# Patient Record
Sex: Female | Born: 1955 | Race: Black or African American | Hispanic: No | Marital: Single | Smoking: Current every day smoker
Health system: Southern US, Community
[De-identification: ages and names within clinical notes are randomized; demographics above are authoritative.]

## PROBLEM LIST (undated history)

## (undated) DIAGNOSIS — Z923 Personal history of irradiation: Secondary | ICD-10-CM

## (undated) DIAGNOSIS — I219 Acute myocardial infarction, unspecified: Secondary | ICD-10-CM

## (undated) DIAGNOSIS — B1920 Unspecified viral hepatitis C without hepatic coma: Secondary | ICD-10-CM

## (undated) HISTORY — PX: ABDOMINAL HYSTERECTOMY: SHX81

## (undated) HISTORY — DX: Acute myocardial infarction, unspecified: I21.9

## (undated) HISTORY — PX: BREAST EXCISIONAL BIOPSY: SUR124

## (undated) HISTORY — DX: Unspecified viral hepatitis C without hepatic coma: B19.20

## (undated) HISTORY — PX: BREAST BIOPSY: SHX20

## (undated) HISTORY — PX: TONSILLECTOMY: SUR1361

## (undated) HISTORY — PX: BILATERAL OOPHORECTOMY: SHX1221

## (undated) HISTORY — PX: JOINT REPLACEMENT: SHX530

---

## 2006-11-23 ENCOUNTER — Ambulatory Visit: Payer: Self-pay | Admitting: Family Medicine

## 2006-11-23 LAB — CONVERTED CEMR LAB
ALT: 37 units/L (ref 0–40)
BUN: 9 mg/dL (ref 6–23)
Bilirubin, Direct: 0.1 mg/dL (ref 0.0–0.3)
Calcium: 8.8 mg/dL (ref 8.4–10.5)
Cholesterol: 179 mg/dL (ref 0–200)
Eosinophils Absolute: 0.1 10*3/uL (ref 0.0–0.6)
GFR calc Af Amer: 137 mL/min
GFR calc non Af Amer: 113 mL/min
Glucose, Bld: 70 mg/dL (ref 70–99)
HDL: 61.5 mg/dL (ref >39.0)
Lymphocytes Relative: 61 % — ABNORMAL HIGH (ref 12.0–46.0)
MCV: 102.2 fL — ABNORMAL HIGH (ref 78.0–100.0)
Monocytes Relative: 6 % (ref 3.0–11.0)
Neutro Abs: 1.8 10*3/uL (ref 1.4–7.7)
Platelets: 236 10*3/uL (ref 150–400)
Triglycerides: 87 mg/dL (ref 0–149)

## 2006-11-30 ENCOUNTER — Encounter: Admission: RE | Admit: 2006-11-30 | Discharge: 2006-11-30 | Payer: Self-pay | Admitting: Family Medicine

## 2006-12-09 ENCOUNTER — Ambulatory Visit: Payer: Self-pay | Admitting: Gastroenterology

## 2006-12-21 ENCOUNTER — Ambulatory Visit: Payer: Self-pay | Admitting: Family Medicine

## 2006-12-21 LAB — CONVERTED CEMR LAB
Basophils Absolute: 0 10*3/uL (ref 0.0–0.1)
Basophils Relative: 0.5 % (ref 0.0–1.0)
Eosinophils Relative: 1 % (ref 0.0–5.0)
HCT: 38.9 % (ref 36.0–46.0)
Hemoglobin: 13.5 g/dL (ref 12.0–15.0)
MCHC: 34.7 g/dL (ref 30.0–36.0)
Monocytes Absolute: 2.2 10*3/uL — ABNORMAL HIGH (ref 0.2–0.7)
Neutrophils Relative %: 30 % — ABNORMAL LOW (ref 43.0–77.0)
RDW: 11.7 % (ref 11.5–14.6)
WBC: 7.2 10*3/uL (ref 4.5–10.5)

## 2008-02-29 ENCOUNTER — Inpatient Hospital Stay (HOSPITAL_COMMUNITY): Admission: RE | Admit: 2008-02-29 | Discharge: 2008-03-04 | Payer: Self-pay | Admitting: Orthopedic Surgery

## 2008-03-07 ENCOUNTER — Ambulatory Visit (HOSPITAL_COMMUNITY): Admission: RE | Admit: 2008-03-07 | Discharge: 2008-03-07 | Payer: Self-pay | Admitting: Orthopedic Surgery

## 2008-03-07 ENCOUNTER — Ambulatory Visit: Payer: Self-pay | Admitting: Surgery

## 2008-03-07 ENCOUNTER — Encounter (INDEPENDENT_AMBULATORY_CARE_PROVIDER_SITE_OTHER): Payer: Self-pay | Admitting: Orthopedic Surgery

## 2008-04-25 ENCOUNTER — Encounter (INDEPENDENT_AMBULATORY_CARE_PROVIDER_SITE_OTHER): Payer: Self-pay | Admitting: Orthopedic Surgery

## 2008-04-25 ENCOUNTER — Ambulatory Visit (HOSPITAL_COMMUNITY): Admission: RE | Admit: 2008-04-25 | Discharge: 2008-04-25 | Payer: Self-pay | Admitting: Orthopedic Surgery

## 2008-04-25 ENCOUNTER — Ambulatory Visit: Payer: Self-pay | Admitting: Vascular Surgery

## 2008-05-11 ENCOUNTER — Encounter (INDEPENDENT_AMBULATORY_CARE_PROVIDER_SITE_OTHER): Payer: Self-pay | Admitting: Orthopedic Surgery

## 2008-05-11 ENCOUNTER — Ambulatory Visit: Admission: RE | Admit: 2008-05-11 | Discharge: 2008-05-11 | Payer: Self-pay | Admitting: Orthopedic Surgery

## 2010-03-22 LAB — CONVERTED CEMR LAB
ALT: 42 units/L
AST: 33 units/L
Albumin: 3.8 g/dL
Alkaline Phosphatase: 29 units/L
Calcium: 9.1 mg/dL
Chloride: 106 meq/L
LDL Cholesterol: 73 mg/dL
Platelets: 243 10*3/uL
Potassium: 4.3 meq/L
RDW: 13.2 %
Sodium: 143 meq/L
VLDL: 22 mg/dL
WBC: 7.7 10*3/uL

## 2010-05-15 ENCOUNTER — Ambulatory Visit: Payer: Self-pay | Admitting: Family Medicine

## 2010-05-15 DIAGNOSIS — F17200 Nicotine dependence, unspecified, uncomplicated: Secondary | ICD-10-CM

## 2010-05-15 DIAGNOSIS — B182 Chronic viral hepatitis C: Secondary | ICD-10-CM

## 2010-05-15 DIAGNOSIS — L723 Sebaceous cyst: Secondary | ICD-10-CM

## 2010-05-15 DIAGNOSIS — I2510 Atherosclerotic heart disease of native coronary artery without angina pectoris: Secondary | ICD-10-CM | POA: Insufficient documentation

## 2010-05-15 LAB — CONVERTED CEMR LAB: Pap Smear: NEGATIVE

## 2010-05-23 ENCOUNTER — Encounter: Payer: Self-pay | Admitting: Family Medicine

## 2010-07-19 ENCOUNTER — Ambulatory Visit: Payer: Self-pay | Admitting: Family Medicine

## 2010-07-19 ENCOUNTER — Encounter: Admission: RE | Admit: 2010-07-19 | Discharge: 2010-07-19 | Payer: Self-pay | Admitting: Family Medicine

## 2010-07-22 ENCOUNTER — Telehealth (INDEPENDENT_AMBULATORY_CARE_PROVIDER_SITE_OTHER): Payer: Self-pay | Admitting: *Deleted

## 2010-09-11 ENCOUNTER — Encounter: Payer: Self-pay | Admitting: *Deleted

## 2010-09-11 ENCOUNTER — Ambulatory Visit: Payer: Self-pay | Admitting: Family Medicine

## 2010-09-25 ENCOUNTER — Encounter: Payer: Self-pay | Admitting: Family Medicine

## 2010-10-02 ENCOUNTER — Ambulatory Visit: Admission: RE | Admit: 2010-10-02 | Discharge: 2010-10-02 | Payer: Self-pay | Source: Home / Self Care

## 2010-10-02 DIAGNOSIS — F4321 Adjustment disorder with depressed mood: Secondary | ICD-10-CM | POA: Insufficient documentation

## 2010-10-04 ENCOUNTER — Telehealth: Payer: Self-pay | Admitting: Psychology

## 2010-10-17 ENCOUNTER — Encounter: Payer: Self-pay | Admitting: Family Medicine

## 2010-10-29 NOTE — Letter (Signed)
Summary: Results Follow-up Letter  All     ,     Phone:   Fax:     05/23/2010  [address] [place], [place]  [address]  Dear Ms. Babinski,   The following are the results of your recent test(s):  Test     Result     Pap Smear    Normal  You should not need further pap smear testing.  Sincerely,    Tinnie Gens MD

## 2010-10-29 NOTE — Assessment & Plan Note (Signed)
Summary: remove cyst and flu shot/bmc   Vital Signs:  Patient profile:   55 year old female Height:      67 inches Weight:      133 pounds BMI:     20.91 Temp:     98.6 degrees F oral Pulse rate:   123 / minute BP sitting:   90 / 60  (right arm) Cuff size:   regular  Vitals Entered By: Garen Grams LPN (July 19, 2010 9:28 AM) CC: remove cyst Is Patient Diabetic? No Pain Assessment Patient in pain? yes     Location: left thumb   CC:  remove cyst.  History of Present Illness: Here for cyst removal on back.  Has been sore for a few days.  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  It is swollen and tender.  She would like an x-ray. Noted lymph node swelling under right breast in past few days.  Has not noted any breast mass.  Habits & Providers  Alcohol-Tobacco-Diet     Alcohol drinks/day: 0     Tobacco Status: current     Tobacco Counseling: to quit use of tobacco products     Cigarette Packs/Day: 0.25  Current Problems (verified): 1)  Thumb Pain, Left  (ICD-729.5) 2)  Tobacco Use Disorder/smoker-smoking Cessation Discussed  (ICD-305.1) 3)  Preventive Health Care  (ICD-V70.0) 4)  Sebaceous Cyst  (ICD-706.2) 5)  Elbow Pain, Right  (ICD-719.42) 6)  Family History of Colon Ca 1st Degree Relative <60  (ICD-V16.0) 7)  Screening For Malignant Neoplasm of The Cervix  (ICD-V76.2) 8)  Coronary Artery Disease  (ICD-414.00) 9)  Hepatitis C  (ICD-070.51)  Current Medications (verified): 1)  Ultram 50 Mg Tabs (Tramadol Hcl) .... By Mouth Two Times A Day To Three Times A Day As Needed 2)  Lipitor 10 Mg Tabs (Atorvastatin Calcium) .... 1/2 Tab By Mouth Daily 3)  Aspirin 81 Mg Tbec (Aspirin) .Marland Kitchen.. 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    Physical Exam  General:  alert, well-developed, and well-nourished.   Head:  normocephalic and atraumatic.   Neck:  supple.   Lungs:  normal respiratory effort.   Heart:  normal rate.   Abdomen:  soft and non-tender.   Skin:  right mid back with sebaceous cyst.  Procedure:  Prepped with Betadine.  Small amount of sebum came out at that time.  Infiltrated with 6 cc Lidocaine.  Vertical skin incision made.  Cyst and wall removed with sharp and blunt dissection. Skin closed with 3 interuppted 4-0 nylon sutures. Axillary Nodes:  Small right mobile, firm node < 0.5 cm noted on right   Impression & Recommendations:  Problem # 1:  TOBACCO USE DISORDER/SMOKER-SMOKING CESSATION DISCUSSED (ICD-305.1)  Problem # 2:  SEBACEOUS CYST (ICD-706.2) Partner instructed on how to take out sutures in 7 days.  Keep area clean.  May shower tomorrow.  Ultram for pain. Orders: I&D Abcess, simple- [facility] (10060)  Problem # 3:  THUMB PAIN, LEFT (ICD-729.5) X-rays ordered and reviewed--negative.  Will use RICE for sprain.  Called pt. but number was disconnected.  Supportive measures were given. Orders: T-DG Finger Thumb*L*  (73140)  Complete Medication List: 1)  Ultram 50 Mg Tabs (Tramadol hcl) .... By mouth two times a day to three times a day as needed 2)  Lipitor 10 Mg Tabs (Atorvastatin calcium) .... 1/2 tab by mouth daily 3)  Aspirin 81 Mg Tbec (Aspirin) .Marland Kitchen.. 1 by mouth daily  Other Orders: [facility]- Est Level  3 (02585) T-Mammography Bilateral Screening (27782) Flu Vaccine 59yrs + (42353) Admin 1st Vaccine (61443)  Patient Instructions: 1)  Please schedule a follow-up appointment in 6 months .  2)  Or 1 week if needed for suture removal. Prescriptions: ULTRAM 50 MG TABS (TRAMADOL HCL) by mouth two times a day to three times a day as needed  #30 x 3   Entered and Authorized by:   Tinnie Gens MD   Signed by:   Tinnie Gens MD on 07/19/2010   Method used:   Print then Give to Patient   RxID:   [id number]    Orders Added: 1)  [facility]- Est Level  3 [67124] 2)  I&D Abcess, simple- [facility] [10060] 3)  T-DG Finger Thumb*L* [73140] 4)  T-Mammography Bilateral Screening [77057] 5)  Flu Vaccine 68yrs + [90658] 6)  Admin 1st Vaccine [58099]   Immunizations Administered:  Influenza Vaccine # 1:    Vaccine Type: Fluvax 3+    Site: right deltoid    Mfr: GlaxoSmithKline    Dose: 0.5 ml    Route: IM    Given by: Garen Grams LPN    Exp. Date: 03/26/2011    Lot #: [id number]    VIS given: 04/23/10 version given July 19, 2010.  Flu Vaccine Consent Questions:    Do you have a history of severe allergic reactions to this vaccine? no    Any prior history of allergic reactions to egg and/or gelatin? no    Do you have a sensitivity to the preservative Thimersol? no    Do you have a past history of Guillan-Barre Syndrome? no    Do you currently have an acute febrile illness? no    Have you ever had a severe reaction to latex? no    Vaccine information given and explained to patient? yes    Are you currently pregnant? no   Immunizations Administered:  Influenza Vaccine # 1:    Vaccine Type: Fluvax 3+     Site: right deltoid    Mfr: GlaxoSmithKline    Dose: 0.5 ml    Route: IM    Given by: Garen Grams LPN    Exp. Date: 03/26/2011    Lot #: [id number]    VIS given: 04/23/10 version given July 19, 2010.

## 2010-10-29 NOTE — Progress Notes (Signed)
Summary: triage  Phone Note Call from Patient Call back at [phone]   Caller: Patient Summary of Call: Hey---tried home number to tell her x-rays were negative, it's been disconnected.  Can you pass info along if she calls for results. Thanks T Initial call taken by: ---- 07/19/2010 1:59 PM, Tinnie Gens MD  Follow-up for Phone Call        LMOVM for pt to call back.  Follow-up by: Jone Baseman CMA  Additional Follow-up for Phone Call Additional follow up Details #1::        Pt wanting results of x-ray of hand.  Hand is still hurting very much and her job is requesting the results also. Additional Follow-up by: Clydell Hakim,  July 22, 2010 8:57 AM    Additional Follow-up for Phone Call Additional follow up Details #2::    LMOVM again Follow-up by: Jone Baseman CMA,  July 22, 2010 9:43 AM  Additional Follow-up for Phone Call Additional follow up Details #3:: Details for Additional Follow-up Action Taken: Shanda Bumps imformed pt of the above. Additional Follow-up by: Clydell Hakim,  July 22, 2010 12:27 PM

## 2010-10-29 NOTE — Assessment & Plan Note (Signed)
Summary: np,df   Vital Signs:  Patient profile:   55 year old female Height:      67 inches Weight:      134 pounds BMI:     21.06 BSA:     1.71 Temp:     98.0 degrees F Pulse rate:   96 / minute BP sitting:   113 / 78  Vitals Entered By: Jone Baseman CMA (May 15, 2010 10:14 AM) CC: New Patient Is Patient Diabetic? No Pain Assessment Patient in pain? yes     Location: right knee Intensity: 7   CC:  New Patient.  History of Present Illness: New pt. today.  Has several complaints.  Has right arm pain and injured it with jamming last yr.  Saw MD who said she had chipped a bone and injected it with steroid.  Now has depigmentation of area and continued pain in elbow radiating to upper arm, and shoulder.  Has tried tylenol. 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  She continues to smoke. Needs pap mammogram, lasat done in 2009.  Habits & Providers  Alcohol-Tobacco-Diet     Alcohol drinks/day: 0     Tobacco Status: current     Tobacco Counseling: to quit use of tobacco products     Cigarette Packs/Day: 0.25  Exercise-Depression-Behavior     Does Patient Exercise: yes     Exercise Counseling: not indicated; exercise is adequate     Exercise (avg: min/session): 30-60     Times/week: 3     STD Risk: never     Drug Use: former, crack cocaine     Seat Belt Use: always     Sun Exposure: infrequent     Cardiologist: Dr. Sharalyn Ink  Current Problems (verified): 1)  Screening For Malignant Neoplasm of The Cervix  (ICD-V76.2) 2)  Coronary Artery Disease  (ICD-414.00) 3)  Hepatitis C  (ICD-070.51)  Current Medications (verified): 1)  Ultram 50 Mg Tabs (Tramadol Hcl) .... By Mouth Two Times A Day To Three Times A Day As Needed 2)  Lipitor 10 Mg Tabs (Atorvastatin Calcium) .... 1/2 Tab By Mouth Daily 3)  Aspirin 81 Mg Tbec (Aspirin) .Marland Kitchen.. 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 Alcohol use-no Drug use-no Regular exercise-yes Smoking Status:  current Packs/Day:  0.25 Does Patient Exercise:  yes STD Risk:  never Drug Use:  former, crack cocaine Seat Belt Use:  always Sun Exposure-Excessive:  infrequent Occupation:  employed Alcohol:  None Sex Orientation:  Homosexual HIV Risk:  no risk noted Sexual History:  currently monogamous  Review of Systems  The patient denies anorexia, fever, weight loss, weight gain, chest pain, syncope, dyspnea on exertion, peripheral edema, prolonged cough, headaches, abdominal pain, melena, severe indigestion/heartburn, abnormal bleeding, and breast masses.    Physical Exam  General:  alert, well-developed, and well-nourished.   Head:  normocephalic and atraumatic.   Neck:  supple.   Breasts:  No mass, nodules, thickening, tenderness, bulging, retraction, inflamation, nipple discharge or skin changes noted.   Lungs:  normal respiratory effort and  normal breath sounds.   Heart:  normal rate, regular rhythm, and no murmur.   Abdomen:  soft and non-tender.   Genitalia:  normal introitus, no external lesions, no vaginal discharge, no vaginal atrophy cervix and uterus are absent.   Msk:  normal ROM, no joint tenderness, and no joint swelling.   Skin:  0.75 x 0.75 cm sebaceous cyst on back Psych:  Oriented X3, normally interactive, and good eye contact.     Shoulder/Elbow Exam  Shoulder Exam:    Right:    Inspection:  Normal    Palpation:  Normal    Stability:  stable     Tenderness:  right bicipital groove    Swelling:  no    Erythema:  no  Forearm Exam:    Right:    Inspection:  Normal    Palpation:  Normal  Elbow Exam:    Right:    Inspection:  Normal    Palpation:  Normal    Stability:  stable    Tenderness:  right lateral epicondyle    Swelling:  no    Erythema:  no   Impression & Recommendations:  Problem # 1:  Preventive Health Care (ICD-V70.0)  Orders: T-Mammography Bilateral Screening ([address])  Problem # 2:  CORONARY ARTERY DISEASE (ICD-414.00)  Her updated medication list for this problem includes:    Aspirin 81 Mg Tbec (Aspirin) .Marland Kitchen... 1 by mouth daily  Problem # 3:  HEPATITIS C (ICD-070.51)  Problem # 4:  ELBOW PAIN, RIGHT (ICD-719.42) Trial of Aleve for 1-2 wks if no improvement consider imaging, vs. other  Problem # 5:  SEBACEOUS CYST (ICD-706.2) to schedule removal at her convenience  Problem # 6:  TOBACCO USE DISORDER/SMOKER-SMOKING CESSATION DISCUSSED (ICD-305.1)  Complete Medication List: 1)  Ultram 50 Mg Tabs (Tramadol hcl) .... By mouth two times a day to three times a day as needed 2)  Lipitor 10 Mg Tabs (Atorvastatin calcium) .... 1/2 tab by mouth daily 3)  Aspirin 81 Mg Tbec (Aspirin) .Marland Kitchen.. 1 by mouth daily  Other Orders: Pap Smear-[facility] ([address]11)  Patient Instructions: 1)  Please schedule a follow-up appointment in 1 month for cyst removal.  Prescriptions: ULTRAM 50 MG TABS (TRAMADOL HCL) by mouth two times a day to three times a day as needed  #30 x 3   Entered and Authorized by:   Tinnie Gens MD   Signed by:   Tinnie Gens MD on 05/15/2010   Method used:   Print then Give to Patient   RxID:   [id number]

## 2010-10-29 NOTE — Miscellaneous (Signed)
Summary: Procedures Consent  Procedures Consent   Imported By: De Nurse 07/25/2010 15:55:29  _____________________________________________________________________  External Attachment:    Type:   Image     Comment:   External Document

## 2010-10-31 NOTE — Assessment & Plan Note (Signed)
Summary: F/U  KH   Vital Signs:  Patient profile:   55 year old female Height:      67 inches Weight:      139.50 pounds BMI:     21.93 BSA:     1.74 Temp:     97.7 degrees F Pulse rate:   90 / minute BP sitting:   127 / 80  Vitals Entered By: Jone Baseman CMA (October 02, 2010 2:36 PM) CC: f/u dog bite Is Patient Diabetic? No Pain Assessment Patient in pain? yes     Location: right leg Intensity: 10   Primary Care Provider:  Tinnie Gens MD  CC:  f/u dog bite.  History of Present Illness: Pt. reports not feeling well.  She has had her mother pass away, and was bitten by a dog at her job.  She is now suffering from moodiness, inability to sleep, not eating, tearful and an extreme fear of dogs.  Habits & Providers  Alcohol-Tobacco-Diet     Alcohol drinks/day: 0     Tobacco Status: current     Tobacco Counseling: to quit use of tobacco products     Cigarette Packs/Day: 0.5  Current Problems (verified): 1)  Foot Sprain, Tarsometatarsal  (ICD-845.11) 2)  Dog Bite  (ICD-E906.0) 3)  Tobacco Use Disorder/smoker-smoking Cessation Discussed  (ICD-305.1) 4)  Preventive Health Care  (ICD-V70.0) 5)  Family History of Colon Ca 1st Degree Relative <60  (ICD-V16.0) 6)  Screening For Malignant Neoplasm of The Cervix  (ICD-V76.2) 7)  Coronary Artery Disease  (ICD-414.00) 8)  Hepatitis C  (ICD-070.51)  Current Medications (verified): 1)  Ultram 50 Mg Tabs (Tramadol Hcl) .... By Mouth Two Times A Day To Three Times A Day As Needed 2)  Lipitor 10 Mg Tabs (Atorvastatin Calcium) .... 1/2 Tab By Mouth Daily 3)  Aspirin 81 Mg Tbec (Aspirin) .Marland Kitchen.. 1 By Mouth Daily 4)  Meloxicam 15 Mg Tabs (Meloxicam) .... One Tab By Mouth Daily For Pain. 5)  Zolpidem Tartrate 5 Mg Tabs (Zolpidem Tartrate) .Marland Kitchen.. 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  The patient denies weight loss, chest pain, dyspnea on exertion, prolonged cough, headaches, and abdominal pain.    Physical Exam  General:  alert, well-developed, and well-nourished.   Psych:  Oriented X3, depressed affect, flat affect, subdued, tearful, and judgment fair.     Impression & Recommendations:  Problem # 1:  DOG BITE (ICD-E906.0) Some mild variant of PTSD--needs therapist---referral to Spero Geralds. Orders: [facility]- Est Level  3 ([address])  Problem # 2:  GRIEF REACTION (ICD-309.0)  Orders: [facility]- Est Level  3 (60454)  Complete Medication List: 1)  Ultram 50 Mg Tabs (Tramadol hcl) .... By mouth two times a day to  three times a day as needed 2)  Lipitor 10 Mg Tabs (Atorvastatin calcium) .... 1/2 tab by mouth daily 3)  Aspirin 81 Mg Tbec (Aspirin) .Marland Kitchen.. 1 by mouth daily 4)  Meloxicam 15 Mg Tabs (Meloxicam) .... One tab by mouth daily for pain. 5)  Zolpidem Tartrate 5 Mg Tabs (Zolpidem tartrate) .Marland Kitchen.. 1-2 by mouth q hs as needed insomnia Prescriptions: ZOLPIDEM TARTRATE 5 MG TABS (ZOLPIDEM TARTRATE) 1-2 by mouth q hs as needed insomnia  #45 x 2   Entered and Authorized by:   Tinnie Gens MD   Signed by:   Tinnie Gens MD on 10/02/2010   Method used:   Print then Give to Patient   RxID:   [id number]    Orders Added: 1)  [facility]- Est Level  3 [14782]

## 2010-10-31 NOTE — Letter (Signed)
Summary: Work Excuse  [facility]  [address]   [place], [place] [address]   Phone: [phone]  Fax: [phone]    Today's Date: September 11, 2010  Name of Patient: Taylor Walker  The above named patient had a medical visit today at:  am / pm.  Please take this into consideration when reviewing the time away from work/school.    Special Instructions:  [  ] None  [  ] To be off the remainder of today, returning to the normal work / school schedule tomorrow.  [  ] To be off until the next scheduled appointment on ______________________.  [ x] Other:  May perform light duty and to be off of her feet for (2) two weeks, permission to drive.  Sincerely yours,   Loralee Pacas CMA

## 2010-10-31 NOTE — Assessment & Plan Note (Signed)
Summary: bit by dog/needs clearance for work/eo   Vital Signs:  Patient profile:   55 year old female Height:      67 inches Weight:      138.7 pounds BMI:     21.80 Temp:     98.4 degrees F oral Pulse rate:   98 / minute BP sitting:   109 / 75  (left arm) Cuff size:   regular  Vitals Entered By: Jimmy Footman, CMA (September 11, 2010 8:39 AM) CC: dog bite yesterday Is Patient Diabetic? No Comments went back to the ED last night and was told that she needed to be seen by ortho will need a referral to dr. Thamas Jaegers in [place] b/c of the strained foot.   Primary Care Moriya Mitchell:  Tinnie Gens MD  CC:  dog bite yesterday.  History of Present Illness: 55 yo female with recent dog bite.  Bite:  Occurred 09/10/10, unprovoked by a Location manager, bitten superficially on the L knee, she has one small puncture wound.  Was seen at [facility] ED.  Rx'ed keflex 500 QID for 7 days.  The dog is currently being observed at an animal shelter for 10 days.  She has no swelling, no erythema, pain is controlled.  Foot sprain:  She notes that when bitten, she fell backwards, started having immediate pain over the dorsum of her R foot.  Painful to walk.  She had xrays at the ED for this that were negative.  She had ACE wrap placed that improved her symptoms.  Current Medications (verified): 1)  Ultram 50 Mg Tabs (Tramadol Hcl) .... By Mouth Two Times A Day To Three Times A Day As Needed 2)  Lipitor 10 Mg Tabs (Atorvastatin Calcium) .... 1/2 Tab By Mouth Daily 3)  Aspirin 81 Mg Tbec (Aspirin) .Marland Kitchen.. 1 By Mouth Daily 4)  Meloxicam 15 Mg Tabs (Meloxicam) .... One Tab By Mouth Daily For Pain. 5)  Keflex 500 Mg Caps (Cephalexin) .... One Tab By Mouth Q6h X 7d  Allergies (verified): No Known Drug Allergies  Review of Systems       See HPI  Physical Exam  General:  Well-developed,well-nourished,in no acute distress; alert,appropriate and cooperative throughout examination Lungs:  Normal respiratory  effort, chest expands symmetrically. Lungs are clear to auscultation, no crackles or wheezes. Heart:  Normal rate and regular rhythm. S1 and S2 normal without gallop, murmur, click, rub or other extra sounds. Msk:  R Ankle: No visible erythema or swelling. Range of motion is full in all directions. Strength is 5/5 in all directions. Stable lateral and medial ligaments; squeeze test and kleiger test unremarkable; Talar dome non tender; No pain at base of 5th MT; No tenderness over cuboid or navicular; Minimal tenderness distal to cuboid over lisfranc joint. No tenderness on posterior aspects of lateral and medial malleolus No sign of peroneal tendon subluxations; Negative tarsal tunnel tinel's Able to walk 4 steps.   Extremities:  L knee with healed abrasion/superficial puncture on lateral aspect of knee.  There is no erythema or induration, no drainage, no evidency of lymphangitis.   Additional Exam:  Pt does have b/l pes planus.  Ankle was wrapped in ACE.   Impression & Recommendations:  Problem # 1:  DOG BITE (ICD-E906.0) Assessment New Pt to finish course of keflex. Pt will keep vigilant on status of dog and let us know if she will need rabies shots (can be sent to ED for this, they have a protocol) Local wound care.  RTC as needed.  Orders: [facility]- Est  Level 4 (81[address])  Problem # 2:  FOOT SPRAIN, TARSOMETATARSAL (ICD-845.11) Assessment: New Advised cont ACE for no more than 2 wks. Stop tylenol #3 and use mobic for pain. RTC 2 weeks to reassess. Can repeat XR with if still painful, would consider assessment and tx for lisfranc injury at that point.  (XR should be AP and wt bearing to assess for this injury) May return to work on light duty and may drive if off tylenol #3.  Orders: [facility]- Est  Level 4 (478[address])  Her updated medication list for this problem includes:    Ultram 50 Mg Tabs (Tramadol hcl) ..... By mouth two times a day to three times a day as needed    Aspirin 81  Mg Tbec (Aspirin) .Marland Kitchen... 1 by mouth daily    Meloxicam 15 Mg Tabs (Meloxicam) ..... One tab by mouth daily for pain.  Complete Medication List: 1)  Ultram 50 Mg Tabs (Tramadol hcl) .... By mouth two times a day to three times a day as needed 2)  Lipitor 10 Mg Tabs (Atorvastatin calcium) .... 1/2 tab by mouth daily 3)  Aspirin 81 Mg Tbec (Aspirin) .Marland Kitchen.. 1 by mouth daily 4)  Meloxicam 15 Mg Tabs (Meloxicam) .... One tab by mouth daily for pain. 5)  Keflex 500 Mg Caps (Cephalexin) .... One tab by mouth q6h x 7d  Patient Instructions: 1)  Great to meet you, 2)  Light duty off feet for 2 weeks, may drive. 3)  Stop the tylenol #3, Mobic for pain. 4)  Keep the foot ACE wrapped for 2 weeks. 5)  Come back to see me after 2 weeks to make sure you are doing well. 6)  Finish the course of antibiotics. 7)  -Dr. Karie Schwalbe. Prescriptions: MELOXICAM 15 MG TABS (MELOXICAM) One tab by mouth daily for pain.  #14 x 0   Entered and Authorized by:   Rodney Langton MD   Signed by:   Rodney Langton MD on 09/11/2010   Method used:   Print then Give to Patient   RxID:   [id number]    Orders Added: 1)  [facility]- Est  Level 4 [95284]

## 2010-10-31 NOTE — Progress Notes (Signed)
Summary: Schedule appt  Phone Note Call from Patient   Caller: Patient Call For: Spero Geralds, Psy.D. Summary of Call: Patient left VM to schedule an appt.  Called and left a VM. Initial call taken by: Spero Geralds PsyD,  October 04, 2010 11:13 AM  Follow-up for Phone Call        She called and we scheduled for 10/10/10 at 9:00.  This is through [employer] (she does not have insurance).  She will call to see if these services are covered and said she would call back if there was any reason she can't attend the appt. Follow-up by: Spero Geralds PsyD,  October 04, 2010 11:25 AM     Appended Document: Schedule appt Called to cancel the appt on the 11th secondary to not being able to get a hold of her workman's comp people.  She said she would call to reschedule.

## 2010-11-05 ENCOUNTER — Encounter: Payer: Self-pay | Admitting: *Deleted

## 2010-11-06 NOTE — Consult Note (Signed)
Summary: [facility]  [facility]   Imported By: De Nurse 10/29/2010 16:54:34  _____________________________________________________________________  External Attachment:    Type:   Image     Comment:   External Document

## 2010-11-06 NOTE — Consult Note (Signed)
Summary: [facility]   Imported By: De Nurse 10/29/2010 16:53:55  _____________________________________________________________________  External Attachment:    Type:   Image     Comment:   External Document

## 2010-12-17 ENCOUNTER — Telehealth: Payer: Self-pay | Admitting: *Deleted

## 2010-12-17 NOTE — Telephone Encounter (Signed)
Received refill request for tramadol . Will send message to Dr. Shawnie Pons. Pharmacy is [facility] [address] [place]

## 2010-12-18 MED ORDER — TRAMADOL HCL 50 MG PO TABS: ORAL_TABLET | ORAL | Status: DC

## 2010-12-18 NOTE — Telephone Encounter (Signed)
Spoke with Dr. Shawnie Pons this AM and she thought she had faxed back to pharmacy rx for ultram on 12/16/2010. Fax request that was faxed back to pharmacy was found howvever was not signed.  Dr. Shawnie Pons ok refill and RN will send in.

## 2010-12-24 ENCOUNTER — Telehealth: Payer: Self-pay | Admitting: Family Medicine

## 2010-12-24 NOTE — Telephone Encounter (Signed)
Pt checking status of rx for tramadol, pharmacy never received it, pt goes to [facility]

## 2010-12-26 MED ORDER — TRAMADOL HCL 50 MG PO TABS: ORAL_TABLET | ORAL | Status: DC

## 2011-02-11 NOTE — Op Note (Signed)
NAME:  Taylor Walker, HAMRE NO.:  [id number]   MEDICAL RECORD NO.:  [id number]          PATIENT TYPE:  INP   LOCATION:  0002                         FACILITY:  [facility]   PHYSICIAN:  Deidre Ala, M.D.    DATE OF BIRTH:  1956/04/20   DATE OF PROCEDURE:  02/29/2008  DATE OF DISCHARGE:                               OPERATIVE REPORT   PREOPERATIVE DIAGNOSIS:  End-stage degenerative joint disease right  knee, especially patellofemoral joint.   POSTOPERATIVE DIAGNOSIS:  End-stage degenerative joint disease right  knee, especially patellofemoral joint, with lateral compartment changes.   PROCEDURE:  Right total knee arthroplasty using cemented DePuy  components LCS type with rotating platform, again cemented, with MBT  revision stem.   SURGEON:  1. Charlesetta Shanks, M.D.   ASSISTANT:  Phineas Semen, P.A.-C.   ANESTHESIA:  General with LMA, with femoral nerve block.   CULTURES:  None.   DRAINS:  Two Hemovac drains, medium, to self suction.   ESTIMATED BLOOD LOSS:  50 mL.   REPLACEMENT:  Without.   TOURNIQUET TIME:  82 minutes.   PATHOLOGIC FINDINGS AND HISTORY:  [name] is a 55 year old female whose  early knee history started in the fall of 1980 when she had a  subluxation of her patella at a basketball game.  Ultimately had an open  patellar realignment, all soft tissue, at [facility].  She then had  an on-the-job injury, never had any subsequent knee problems until she  stepped off a step in a storage room on her job at [employer] on October 25, 2006.  She was seen in [facility] at  [facility], and had knee pain.  MRI scan showed grade 4 DJD  patellofemoral joint, with some lateral compartment changes.  She had  level pain 5-6/8, difficulty walking.  She consulted an orthopedic  surgeon in [facility], who said that she might need a patellofemoral  replacement or a total knee.  Ultimately, she was sent to me for a  second opinion.  That  discussion was carried out.  I felt she might be a  candidate for patellofemoral replacement, but the insurance company  would not improve anything other than a total knee replacement, and  hence that is what was done.  At surgery, she had marked end-stage  disease of the patellofemoral joint, with a thinned patella, bone on  bone on the lateral trochlea, with complete layer and remodeling.  There  was an area on the lateral femoral condyle of cartilage pitting in the  central zone that may have progressed.  The rest of the joint looked  fairly good.  We did put a total knee arthroplasty in and got excellent  ligament balance.  She was a bit of a difficult fit because her  anterior, posterior, medial, and lateral sides fit best with a standard  plus femur. However, to get the appropriate flexion and extension gap  without taking more than 5 mm on the extension side to get 12.5 mm of  poly in, we had cut more on the tibia, but then downsized the  flexion  gap to get the 12.5.  This notched about 4 mm the more proximal anterior  femur, but she has very hard bone, and we ended up grafting this at the  closure of the case.  This to allow [place] to get a 12.5 flexion gap.  The  extension gap was 12.5 without taking 7.5 mm on the distal cut, so we  compromised distal femoral cut to be less for a bit of the notch, but we  ended up with full extension and flexion to about 110 degrees, stable to  stressing, excellent overall alignment, using a standard plus right  femur, a rotating platform size standard plus, oval dome patella three-  peg 35 mm.  We used an MBT revision cemented tray size 4, which was  slightly large but we felt had overall better peripheral coverage, and a  universal fluted stem, 75 x a 16 for the MBT revision stem which I used  routinely because of its better mechanics for shunting weight distally  and to prevent tibial loosening over time with a closed-tip end, not  producing  significant stem pain.   PROCEDURE:  With adequate anesthesia obtained using femoral nerve block  and LMA technique, 1 g Ancef given for IV prophylaxis, and another one  at tourniquet letdown, the patient was placed in the supine position.  The right lower extremity was prepped from the toes to the tourniquet in  the standard fashion.  After standard prepping and draping, Esmarch  exsanguination was used, and the tourniquet was let up to 300 mmHg.  We  then incised the relatively straight somewhat hypertrophic scar over the  medial third of the patella and excised that and expanded it distally  and proximally.  Incision was deepened sharply with a knife and  hemostasis obtained using the Bovie electrocoagulator.  We then entered  the joint through the medial retinaculum.  The patella was everted.  Fat  pad excised, as well as scar tissue and osteophytes.  We then removed  the menisci and the cruciates.  I then amputated the tibial spine and  placed the drill down the canal in slight varus position to effect leg  valgus slight and then drilled downward, used the canal finder, and then  reamed up to a #16.  Off this, the 2-degree cutting jig was then put in  place and set at 6 mm, the cut made.  We made 2.5 more millimeters  because it seemed very tight in flexion, and we then sized the femur to  a standard plus, placed the anterior-posterior cutting jig in place with  the intramedullary guide, set it with a 17.5, set it, pinned it, made  the anterior cut, and it was a very shallow anterior cut.  We felt we  could downsize the flexion gap so moved it down 2.5 more millimeters,  made the anterior cut and then the posterior cut, fitting the 12,5 in  flexion, but the slight notch anteriorly.  I then did the 4-degree  distal valgus femoral cutting jig, put that in place.  I had to set it  back 5 more millimeters to accommodate 12.5 in extension, made the cut.  We fit 12.5 in flexion and  extension.  I then placed a finishing guide  on the distal femur, made the anterior, posterior, and chamfer, the  notch cut, as well as a far posterior cut.  I then exposed the proximal  tibia, sized it to a 4, reamed, the conical  reamer proximally and the  distal reamer with the 16 x 75.  I then placed a trial in place with the  appropriate rotation and impacted it with the keel punch.  I then placed  the trial tibial component in place, rotating platform.  I placed the  femoral component in place and articulated the knee through a range of  motion.  I then calipered the patella, started at a 28, felt like it had  been somewhat worn, and actually significantly worn by the arthritis, so  we cut it, set the cutting jig on 16, actually cut to a 14 with the  cutting jig, cutting the patella down to a 14, removing all of the  sclerotic bone to fresh cancellous bone, placed the three-peg hole guide  for the 35, drilled the holes, place a trial, and articulated it through  a range of motion.  All trial components were then removed while  components were brought on the field and checked for sizing.  We then  thoroughly jet lavaged the knee.  We then assembled the tibial  component.  We then mixed cement with tobramycin, 2 batches of cement  with 1.35 g of tobramycin per batch.  We then cemented on the tibial  component, impacted it, and removed excess cement.  We placed the  rotating platform.  We then cemented on the femoral component, impacted  it, and removed excess cement, and held the knee in full extension, and  removed excess cement.  We then cemented on the patella component,  impacted it, removed excess cement, and held the knee in slight flexion  until the cement had cured.  We then re-jet lavaged the knee with the  remaining solution.  Tourniquet was let down.  Bleeding points were  cauterized, and 10 mg of FloSeal was injected in the wound.  We then  placed Hemovac drains in the  medial and lateral gutter and brought it  out the superior lateral portal.  The wound was then closed in layers  with #1 Vicryl figure-of-eight interrupted on the retinaculum, with a  running-locking oversew with #1 PDS; 0 and 2-0 Vicryl were then used on  the subcutaneous, and a running 3-0 Monocryl.  Steri-Strips were  applied.  We then injected a mixture of 40 mg of gentamicin in 20 mL of  sterile saline into the knee through one of the Hemovacs and then close  them off with rubber shot.  They will be let open in about  2 hours in the PACU.  We then placed a bulky sterile compressive  dressing with a knee immobilizer.  The patient, having tolerated the  procedure well, was awakened and taken to the recovery room in  satisfactory condition, to be admitted for routine postoperative care  and CPM and analgesia.           ______________________________  V. Charlesetta Shanks, M.D.     VEP/MEDQ  D:  02/29/2008  T:  02/29/2008  Job:  [id number]

## 2011-02-11 NOTE — Discharge Summary (Signed)
Taylor Walker, Taylor Walker NO.:  [id number]   MEDICAL RECORD NO.:  [id number]          PATIENT TYPE:  INP   LOCATION:  1606                         FACILITY:  [facility]   PHYSICIAN:  Deidre Ala, M.D.    DATE OF BIRTH:  12/10/1955   DATE OF ADMISSION:  02/29/2008  DATE OF DISCHARGE:  03/04/2008                               DISCHARGE SUMMARY   FINAL DIAGNOSES:  1. Degenerative joint disease, right knee, end-stage.  2. Postoperative blood-loss anemia.   PROCEDURES:  Right total knee arthroplasty with DePuy LCS with MBT stem,  rotating platform.   SURGEON:  1. Charlesetta Shanks, M.D.   HISTORY:  This is a 55 year old African-American female who is followed  by Dr. Renae Fickle.  She complained of right knee pain.  She was treated  conservatively initially.  This failed.  She was deemed to need a total  knee arthroplasty.  Because of this, she was scheduled.   HOSPITAL COURSE:  She was admitted to [facility] on 02/29/08.  At that  time, she underwent a right total knee arthroplasty.  Patient tolerated  the procedure well.  No intraoperative complications.  Postoperatively,  the patient did well.  No untoward events occurred.  She did have a  couple of spiked temperatures but overall, nothing significant occurred.  She had postop blood-loss anemia noted.  This did not recall blood  product replacement.  She was started on Lovenox as per routine.  She  was seen by physical therapy and reportedly was getting out of bed and  ambulating.  She had drains in until the second postoperative day, at  which time these were changed.  The drains have been full drainage.  The  incision was healing well.   By the fourth postoperative day, she was ready for discharge.  She was  able to get out of bed by herself with just a walker without any  assistance.  Subsequently, she was ready for discharge.   DISCHARGE MEDICATIONS:  1. Lovenox 30 mg subcu 1 q.12h. x10 days.  2. Skelaxin 800 mg 1 p.o.  q.i.d. p.r.n. for muscle spasms.  3. Lortab 5/500 1-2 p.o. q.4-6h. p.r.n. pain.  4. She will continue on her regular medicines that she was taking      prior to admission, which was just actually a over-the-counter      vitamin tablet, and Sudafed as needed for allergies.  Otherwise,      she was taking no other medications.   She had no known drug allergies.   She was subsequently discharged to home on 03/04/08 in satisfactory and  stable condition.  She will follow up with Dr. Renae Fickle in 10 days.      Phineas Semen, P.A.    ______________________________  Seth Bake. Charlesetta Shanks, M.D.    CL/MEDQ  D:  03/04/2008  T:  03/04/2008  Job:  [id number]

## 2011-02-14 NOTE — Assessment & Plan Note (Signed)
[facility]                        [place] OFFICE NOTE   IMAYA, DUFFY                       MRN:          [id number]  DATE:11/23/2006                            DOB:          Feb 20, 1956    The patient is a 55 year old African-American female who presents to the  office today to establish and for a physical exam.  The patient is  concerned about weight loss.  Apparently she has lost over 20 pounds in  the last few years.  She states she is eating normally and has been  drinking Ensure three times a day with two meals for several years.  She  has always done this; there has been no change in her diet.  The patient  recently moved back from [place].  She was working as a Solicitor with the  [employer] and had been living in [place] for the last couple of years  where she was going to [facility] for hepatitis C clinic.  She is also here  today to get established with a new hepatitis specialist in town.  She  has an appointment with her gynecologist on March 13 for her annual Pap  smear and mammogram.   PAST MEDICAL HISTORY:  Significant for hepatitis C.  She was treated  with interferon at [facility].   PAST SURGICAL HISTORY:  Significant for three rotator cuff surgeries.   FAMILY HISTORY:  She has a mother who is alive with history of colon  cancer.  Father died from unknown cancer.  There is no other family  history.   SOCIAL HISTORY:  She smokes a half a pack a day for the last 20 years.  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  She is single and not currently sexually active.  She exercises three times a week.   MEDICATIONS:  She is only taking tramadol 50 mg once a day as needed for  pain.  She has no known drug allergies.  Last tetanus shot was in  2006.  She received the flu shot in 2007.   PHYSICAL EXAMINATION:  VITAL SIGNS:  Her height is 5 feet 7-and-a-half  inches, weight 128.8, temperature 98.1, pulse 88, respirations 20, blood  pressure is 104/64.  GENERAL:  She is awake, alert and oriented, in no acute distress, but is  very cachectic looking.  HEENT:  Head is normocephalic, atraumatic.  Eyes:  Pupils equal and  reactive to light.  Sclerae are white.  Tympanic membranes are intact  bilaterally and clear.  Nose:  Turbinates are pink.  Oropharynx is clear  and mucous membranes are moist.  NECK:  Supple, no JVD, no bruits.  Thyroid is normal.  RESPIRATIONS:  Lungs are clear bilaterally to auscultation.  No rales,  rhonchi or wheezing.  HEART:  Positive S1, S2.  No murmurs are appreciated.  ABDOMEN:  Soft and nontender.  No masses are palpated.  MUSCULOSKELETAL:  Shows good strength in all four extremities, no  sensorimotor deficits.  SKIN:  No rashes or significant moles.  GYNECOLOGIC:  Exam to be done in March by the gynecologist.   EKG:  Sinus rhythm, about 81 beats per minute.   ASSESSMENT AND PLAN:  1. Complete physical examination.  Pap is to be done by the GYN.  Will      check fasting labs today.  General health maintenance is not up-to-      date but she is working on getting her mammogram scheduled with the      gynecologist.  2. History of hepatitis C.  Will need to transfer to medical specialty      clinic.  She will arrange for [place] to receive her medical records      from [facility] so we can arrange for her to see them as soon as possible.  3. History of weight loss.  Will check HIV and other STDs in the blood      work.  Will get a chest x-ray and refer to GI for colonoscopy.  Did      encourage the patient to eat three meals a day as well as adding      the Ensure to the diet, and she will follow up with [place] in 2 weeks      for a weight check, or sooner if necessary.     Lelon Perla, DO  Electronically  Signed    Shawnie Dapper  DD: 11/24/2006  DT: 11/24/2006  Job #: [id number]

## 2011-02-14 NOTE — Assessment & Plan Note (Signed)
Taylor Walker                         GASTROENTEROLOGY OFFICE NOTE   Taylor Walker, Taylor Walker                       MRN:          [id number]  DATE:12/09/2006                            DOB:          [birth year]    REASON FOR REFERRAL:  Weight loss and family history of colon cancer.   HISTORY OF PRESENT ILLNESS:  This is a 55 year old African American  female with a self reported weight loss of 20 pounds over the past 8-9  months.  She states her appetite is good.  She notes no gastrointestinal  complaints whatsoever and specifically denies any abdominal pain, change  in bowel habits, diarrhea, constipation, change in stool caliber,  melena, hematochezia, nausea, vomiting, or reflux symptoms.  A recent  metabolic panel, CBC, lipid profile, and TSH were all unremarkable  except for an elevated MCV at 102.2 and a minimally elevated LDL  cholesterol at 100.  Chest x-ray was negative.  Her mother developed  colon cancer at age 56, no other family members with colon cancer, colon  polyps, or inflammatory bowel disease.   PAST MEDICAL HISTORY:  1. Hepatitis C, diagnosed in about 1999 at [facility] of      [place].  She states she was treated with interferon and ribavirin      in 2000 and that failed.  She states she has been followed for this      problem and has had a subsequent liver biopsy in 2005 that shows      very minimal activity and no worrisome findings.  2. Status post hernia repair 1986.  3. Status post breast biopsy.  4. Status post appendectomy.   CURRENT MEDICATIONS:  Multivitamin daily.   MEDICATION ALLERGIES:  NONE KNOWN.   SOCIAL HISTORY:  Per the handwritten form.   REVIEW OF SYSTEMS:  Per the handwritten form.   PHYSICAL EXAMINATION:  Thin, in no acute distress.  Height 5 feet 8  inches, weight 128.8 pounds, blood pressure 106/70, pulse 80 and  regular.  HEENT:  Anicteric sclerae, oropharynx clear.  CHEST:  Clear to  auscultation bilaterally.  CARDIAC:  Regular rate and rhythm without murmurs appreciated.  ABDOMEN:  Soft, nontender, nondistended, normoactive bowel sounds, no  palpable organomegaly, masses, or hernias.  RECTAL:  Deferred to time of colonoscopy.  EXTREMITIES:  Without clubbing, cyanosis, edema.  NEUROLOGIC:  Alert and oriented x3, grossly nonfocal.   ASSESSMENT/PLAN:  1. Self reported weight loss of 20 pounds.  First degree relative with      colon cancer at age 70.  She has no obvious gastrointestinal      disorder but colorectal neoplasms should be excluded.  Risks,      benefits, and alternatives to colonoscopy with possible biopsy and      possible polypectomy discussed with the patient and she consents to      proceed.  This will be scheduled electively.  2. Chronic hepatitis C.  Referral to the medical specialty clinic for      ongoing management.  It appears that she has minimal disease and      she has  been monitored on an interval basis.  3. Macrocytosis.  Further evaluation per Dr. Laury Walker.     Venita Lick. [name], MD, [facility]  Electronically Signed    MTS/MedQ  DD: 12/14/2006  DT: 12/15/2006  Job #: [id number]   cc:   Taylor Perla, DO

## 2011-04-08 ENCOUNTER — Other Ambulatory Visit: Payer: Self-pay | Admitting: Family Medicine

## 2011-04-08 MED ORDER — TRAMADOL HCL 50 MG PO TABS: ORAL_TABLET | ORAL | Status: DC

## 2011-04-16 ENCOUNTER — Ambulatory Visit (INDEPENDENT_AMBULATORY_CARE_PROVIDER_SITE_OTHER): Payer: BC Managed Care – PPO | Admitting: Family Medicine

## 2011-04-16 ENCOUNTER — Ambulatory Visit
Admission: RE | Admit: 2011-04-16 | Discharge: 2011-04-16 | Disposition: A | Discharge status: Home / Self Care | Payer: BC Managed Care – PPO | Source: Ambulatory Visit | Attending: Family Medicine | Admitting: Family Medicine

## 2011-04-16 ENCOUNTER — Encounter: Payer: Self-pay | Admitting: Family Medicine

## 2011-04-16 ENCOUNTER — Telehealth: Payer: Self-pay | Admitting: *Deleted

## 2011-04-16 DIAGNOSIS — R634 Abnormal weight loss: Secondary | ICD-10-CM

## 2011-04-16 DIAGNOSIS — Z1231 Encounter for screening mammogram for malignant neoplasm of breast: Secondary | ICD-10-CM

## 2011-04-16 DIAGNOSIS — M1990 Unspecified osteoarthritis, unspecified site: Secondary | ICD-10-CM | POA: Insufficient documentation

## 2011-04-16 DIAGNOSIS — M129 Arthropathy, unspecified: Secondary | ICD-10-CM

## 2011-04-16 DIAGNOSIS — G4700 Insomnia, unspecified: Secondary | ICD-10-CM | POA: Insufficient documentation

## 2011-04-16 DIAGNOSIS — Z0000 Encounter for general adult medical examination without abnormal findings: Secondary | ICD-10-CM

## 2011-04-16 DIAGNOSIS — I2510 Atherosclerotic heart disease of native coronary artery without angina pectoris: Secondary | ICD-10-CM

## 2011-04-16 LAB — CBC
HCT: 43.5 % (ref 36.0–46.0)
Hemoglobin: 14.9 g/dL (ref 12.0–15.0)
MCH: 33.5 pg (ref 26.0–34.0)
MCHC: 34.3 g/dL (ref 30.0–36.0)
RDW: 12.4 % (ref 11.5–15.5)

## 2011-04-16 LAB — BASIC METABOLIC PANEL
CO2: 25 mEq/L (ref 19–32)
Calcium: 9.7 mg/dL (ref 8.4–10.5)
Chloride: 105 mEq/L (ref 96–112)
Creat: 0.87 mg/dL (ref 0.50–1.10)
Glucose, Bld: 73 mg/dL (ref 70–99)

## 2011-04-16 LAB — TSH: TSH: 0.321 u[IU]/mL — ABNORMAL LOW (ref 0.350–4.500)

## 2011-04-16 LAB — LIPID PANEL
HDL: 67 mg/dL (ref >39)
Triglycerides: 82 mg/dL (ref <150)

## 2011-04-16 MED ORDER — ZOLPIDEM TARTRATE 5 MG PO TABS: 5.0000 mg | ORAL_TABLET | Freq: Every evening | ORAL | Status: DC | PRN

## 2011-04-16 MED ORDER — ATORVASTATIN CALCIUM 10 MG PO TABS: 10.0000 mg | ORAL_TABLET | Freq: Every day | ORAL | Status: DC

## 2011-04-16 MED ORDER — TRAMADOL HCL 50 MG PO TABS
ORAL_TABLET | ORAL | Status: DC
Start: 2011-04-16 — End: 2011-09-22

## 2011-04-16 NOTE — Telephone Encounter (Signed)
Received call from pharmacy  stating they need clarification on directions for generic lipitor. There are two directions  on RX.  Dr. Shawnie Pons advised this AM when she left clinic that she will be on vacation, will send to preceptor for advise.

## 2011-04-16 NOTE — Progress Notes (Signed)
  Subjective:     Taylor Walker is a 55 y.o. female and is here for a comprehensive physical exam. The patient reports problems - Weight loss.  History   Social History  . Marital Status: Single    Spouse Name: N/A    Number of Children: N/A  . Years of Education: N/A   Occupational History  . Not on file.   Social History Main Topics  . Smoking status: Current Everyday Smoker -- 0.3 packs/day    Types: Cigarettes  . Smokeless tobacco: Not on file  . Alcohol Use: Not on file  . Drug Use: Not on file  . Sexually Active: Not on file   Other Topics Concern  . Not on file   Social History Narrative  . No narrative on file   Health Maintenance  Topic Date Due  . Pap Smear  03/13/1976  . Tetanus/tdap  03/13/1977  . Mammogram  03/13/2008  . Colonoscopy  03/13/2008  . Influenza Vaccine  06/30/2011    The following portions of the patient's history were reviewed and updated as appropriate: allergies, current medications, past family history, past medical history, past social history, past surgical history and problem list.  Review of Systems A comprehensive review of systems was negative.   Objective:    BP 120/74  Pulse 80  Temp(Src) 97.9 F (36.6 C) (Oral)  Wt 135 lb (61.236 kg) General appearance: alert, cooperative and appears stated age Head: Normocephalic, without obvious abnormality, atraumatic Neck: no adenopathy, supple, symmetrical, trachea midline and thyroid not enlarged, symmetric, no tenderness/mass/nodules Lungs: clear to auscultation bilaterally Heart: regular rate and rhythm, S1, S2 normal, no murmur, click, rub or gallop Abdomen: soft, non-tender; bowel sounds normal; no masses,  no organomegaly Extremities: extremities normal, atraumatic, no cyanosis or edema Pulses: 2+ and symmetric Skin: Skin color, texture, turgor normal. No rashes or lesions Neurologic: Grossly normal    Assessment:    Healthy female exam. Weight loss     Plan:    Does not  need pap smear. Check labs. Refill meds. Mammogram See After Visit Summary for Counseling Recommendations

## 2011-04-16 NOTE — Telephone Encounter (Signed)
Spoke with patient and she states she has been taking 1/2 tab of the atorvastatin daily. Will ask MD to send in new RX.

## 2011-04-17 ENCOUNTER — Encounter: Payer: Self-pay | Admitting: Family Medicine

## 2011-04-17 MED ORDER — ATORVASTATIN CALCIUM 10 MG PO TABS: ORAL_TABLET | ORAL | Status: DC

## 2011-04-17 NOTE — Telephone Encounter (Signed)
New Rx sent.

## 2011-04-28 ENCOUNTER — Telehealth: Payer: Self-pay | Admitting: Family Medicine

## 2011-04-28 DIAGNOSIS — I2510 Atherosclerotic heart disease of native coronary artery without angina pectoris: Secondary | ICD-10-CM

## 2011-04-28 MED ORDER — NITROGLYCERIN 0.4 MG SL SUBL: 0.4000 mg | SUBLINGUAL_TABLET | SUBLINGUAL | Status: DC | PRN

## 2011-04-28 NOTE — Telephone Encounter (Signed)
Just saw Dr Shawnie Pons last week and she is now needing refill on her Nitro pills.  She no longer sees cardiologist and wants this refilled by Dr Shawnie Pons.  She already refilled her Lipitor from the other doctor and now needs Nitro.

## 2011-04-28 NOTE — Assessment & Plan Note (Signed)
Needs refill on Nitro

## 2011-06-25 LAB — URINALYSIS, ROUTINE W REFLEX MICROSCOPIC
Hgb urine dipstick: NEGATIVE
Protein, ur: NEGATIVE
Urobilinogen, UA: 1

## 2011-06-25 LAB — COMPREHENSIVE METABOLIC PANEL
AST: 28
Albumin: 3.5
BUN: 12
Calcium: 9.2
Chloride: 107
Creatinine, Ser: 0.83
GFR calc Af Amer: >60
Total Protein: 6

## 2011-06-25 LAB — DIFFERENTIAL
Basophils Absolute: 0
Eosinophils Relative: 2
Lymphocytes Relative: 31
Lymphs Abs: 1.9
Monocytes Absolute: 0.7
Monocytes Relative: 11
Neutro Abs: 3.5

## 2011-06-25 LAB — CBC
HCT: 39.5
MCV: 99.6
Platelets: 219
RDW: 11.9
WBC: 6.2

## 2011-06-25 LAB — APTT: aPTT: 32

## 2011-06-25 LAB — URINE CULTURE
Culture: NO GROWTH
Special Requests: NEGATIVE

## 2011-06-26 LAB — CBC
HCT: 30.8 — ABNORMAL LOW
HCT: 33 — ABNORMAL LOW
Hemoglobin: 11.2 — ABNORMAL LOW
MCHC: 34
MCV: 99.2
Platelets: 170
Platelets: 173
RDW: 12.3
RDW: 12.3
RDW: 12.6
WBC: 10.5

## 2011-06-26 LAB — BASIC METABOLIC PANEL
BUN: 5 — ABNORMAL LOW
CO2: 27
Chloride: 101
GFR calc non Af Amer: >60
Glucose, Bld: 107 — ABNORMAL HIGH
Glucose, Bld: 129 — ABNORMAL HIGH
Potassium: 4
Potassium: 4.3
Sodium: 136

## 2011-06-26 LAB — TYPE AND SCREEN: ABO/RH(D): A POS

## 2011-09-11 ENCOUNTER — Telehealth: Payer: Self-pay | Admitting: Family Medicine

## 2011-09-11 NOTE — Telephone Encounter (Signed)
Pt needs to talk to nurse about her lipitor - having some strange side affects

## 2011-09-11 NOTE — Telephone Encounter (Signed)
Left message for patient to call back  

## 2011-09-12 NOTE — Telephone Encounter (Signed)
Spoke with patient, she states she took the Lipitor 10mg  on Monday and Tuesday morning and that by Tues and Wed night she was extremely paranoid and didn't take anymore. Patient states she used to take Lipitor 80mg  with no problem. Message to MD.

## 2011-09-12 NOTE — Telephone Encounter (Signed)
Left another message for patient to call back 

## 2011-09-15 NOTE — Telephone Encounter (Signed)
Pt called back, states that MD LMovm asking her to call back here.  Not sure as I do not have a note from MD.  Will forward back to her.  FYI: pt states that only thing she can think of is that this was the generic brand and she used to take the name brand. Shanan Mcmiller, Maryjo Rochester

## 2011-09-17 NOTE — Telephone Encounter (Signed)
Per note, pt. Prev. On Lipitor 80.  Written for lipitor 10 and took 1/2 of dosage. I was questioning dose.  Rx sent in July, is she just now taking it for the first time.  Does she need non-generic?  Please call pt. And ascertain this info?

## 2011-09-17 NOTE — Telephone Encounter (Signed)
LMOVM for pt to schedule an appt with Dr. Shawnie Pons to discuss this med. Shaughnessy Gethers, Maryjo Rochester

## 2011-09-22 ENCOUNTER — Other Ambulatory Visit: Payer: Self-pay | Admitting: Family Medicine

## 2011-09-25 ENCOUNTER — Encounter (INDEPENDENT_AMBULATORY_CARE_PROVIDER_SITE_OTHER): Payer: BC Managed Care – PPO | Admitting: Family Medicine

## 2011-09-25 ENCOUNTER — Encounter: Payer: Self-pay | Admitting: Family Medicine

## 2011-09-25 ENCOUNTER — Telehealth: Payer: Self-pay | Admitting: *Deleted

## 2011-09-25 NOTE — Telephone Encounter (Signed)
New lipitor that she was given on July had paranoid side affects.  The original Rx was name brand lipitor, the new one was generic.  Pt came for appt but had to reschedule would like Md to give her a call.Taylor Walker, Taylor Walker

## 2011-09-26 MED ORDER — ATORVASTATIN CALCIUM 80 MG PO TABS: 80.0000 mg | ORAL_TABLET | Freq: Every day | ORAL | Status: DC

## 2011-09-26 NOTE — Telephone Encounter (Signed)
Back to MD Fleeger, Maryjo Rochester

## 2011-09-26 NOTE — Progress Notes (Signed)
This encounter was created in error - please disregard.

## 2011-09-26 NOTE — Telephone Encounter (Signed)
Lipitor is the brand and 80mg  that she needs.  Doesn't need generic

## 2011-09-26 NOTE — Telephone Encounter (Signed)
-----   Message from Reva Bores, MD sent at 09/26/2011 11:31 AM -----  Should be at pharmacy   Pt informed that Rx was sent. Fleeger, Maryjo Rochester

## 2011-09-26 NOTE — Telephone Encounter (Signed)
LVM for pt. About generic vs. Name brand and exact dosage of medication she is supposed to be on.  She will return call and I will try to correct today.

## 2011-10-28 ENCOUNTER — Ambulatory Visit: Payer: BC Managed Care – PPO | Admitting: Family Medicine

## 2011-11-07 ENCOUNTER — Other Ambulatory Visit: Payer: Self-pay | Admitting: Family Medicine

## 2011-11-10 NOTE — Telephone Encounter (Signed)
LMOVM of pharmacy with refill request. Taylor Walker, Taylor Walker

## 2011-11-10 NOTE — Telephone Encounter (Signed)
Ok to refill, please call in for pt.  Cannot be e-prescribed.

## 2011-12-10 ENCOUNTER — Other Ambulatory Visit: Payer: Self-pay | Admitting: Family Medicine

## 2012-02-17 ENCOUNTER — Other Ambulatory Visit: Payer: Self-pay | Admitting: Family Medicine

## 2012-03-11 ENCOUNTER — Other Ambulatory Visit: Payer: Self-pay | Admitting: Family Medicine

## 2012-03-11 DIAGNOSIS — Z1231 Encounter for screening mammogram for malignant neoplasm of breast: Secondary | ICD-10-CM

## 2012-03-29 ENCOUNTER — Ambulatory Visit (INDEPENDENT_AMBULATORY_CARE_PROVIDER_SITE_OTHER): Payer: BC Managed Care – PPO | Admitting: Family Medicine

## 2012-03-29 ENCOUNTER — Encounter: Payer: Self-pay | Admitting: Family Medicine

## 2012-03-29 ENCOUNTER — Ambulatory Visit
Admission: RE | Admit: 2012-03-29 | Discharge: 2012-03-29 | Disposition: A | Discharge status: Home / Self Care | Payer: BC Managed Care – PPO | Source: Ambulatory Visit | Attending: Family Medicine | Admitting: Family Medicine

## 2012-03-29 VITALS — BP 106/70 | HR 80 | Temp 98.2°F | Ht 67.0 in | Wt 144.0 lb

## 2012-03-29 DIAGNOSIS — Z1231 Encounter for screening mammogram for malignant neoplasm of breast: Secondary | ICD-10-CM

## 2012-03-29 DIAGNOSIS — M1990 Unspecified osteoarthritis, unspecified site: Secondary | ICD-10-CM

## 2012-03-29 DIAGNOSIS — G4700 Insomnia, unspecified: Secondary | ICD-10-CM

## 2012-03-29 DIAGNOSIS — I2510 Atherosclerotic heart disease of native coronary artery without angina pectoris: Secondary | ICD-10-CM

## 2012-03-29 DIAGNOSIS — B1710 Acute hepatitis C without hepatic coma: Secondary | ICD-10-CM

## 2012-03-29 DIAGNOSIS — M129 Arthropathy, unspecified: Secondary | ICD-10-CM

## 2012-03-29 MED ORDER — ATORVASTATIN CALCIUM 10 MG PO TABS: 2.5000 mg | ORAL_TABLET | Freq: Every day | ORAL | Status: DC

## 2012-03-29 MED ORDER — ASPIRIN 81 MG PO TBEC: 81.0000 mg | DELAYED_RELEASE_TABLET | Freq: Every day | ORAL | Status: AC

## 2012-03-29 MED ORDER — ZOLPIDEM TARTRATE 5 MG PO TABS: 5.0000 mg | ORAL_TABLET | Freq: Every evening | ORAL | Status: DC | PRN

## 2012-03-29 MED ORDER — TRAMADOL HCL 50 MG PO TABS: 50.0000 mg | ORAL_TABLET | Freq: Three times a day (TID) | ORAL | Status: DC | PRN

## 2012-03-29 MED ORDER — NITROGLYCERIN 0.4 MG SL SUBL: 0.4000 mg | SUBLINGUAL_TABLET | SUBLINGUAL | Status: DC | PRN

## 2012-03-29 NOTE — Patient Instructions (Signed)
Smoking Cessation This document explains the best ways for you to quit smoking and new treatments to help. It lists new medicines that can double or triple your chances of quitting and quitting for good. It also considers ways to avoid relapses and concerns you may have about quitting, including weight gain. NICOTINE: A POWERFUL ADDICTION If you have tried to quit smoking, you know how hard it can be. It is hard because nicotine is a very addictive drug. For some people, it can be as addictive as heroin or cocaine. Usually, people make 2 or 3 tries, or more, before finally being able to quit. Each time you try to quit, you can learn about what helps and what hurts. Quitting takes hard work and a lot of effort, but you can quit smoking. QUITTING SMOKING IS ONE OF THE MOST IMPORTANT THINGS YOU WILL EVER DO.  You will live longer, feel better, and live better.   The impact on your body of quitting smoking is felt almost immediately:   Within 20 minutes, blood pressure decreases. Pulse returns to its normal level.   After 8 hours, carbon monoxide levels in the blood return to normal. Oxygen level increases.   After 24 hours, chance of heart attack starts to decrease. Breath, hair, and body stop smelling like smoke.   After 48 hours, damaged nerve endings begin to recover. Sense of taste and smell improve.   After 72 hours, the body is virtually free of nicotine. Bronchial tubes relax and breathing becomes easier.   After 2 to 12 weeks, lungs can hold more air. Exercise becomes easier and circulation improves.   Quitting will reduce your risk of having a heart attack, stroke, cancer, or lung disease:   After 1 year, the risk of coronary heart disease is cut in half.   After 5 years, the risk of stroke falls to the same as a nonsmoker.   After 10 years, the risk of lung cancer is cut in half and the risk of other cancers decreases significantly.   After 15 years, the risk of coronary heart  disease drops, usually to the level of a nonsmoker.   If you are pregnant, quitting smoking will improve your chances of having a healthy baby.   The people you live with, especially your children, will be healthier.   You will have extra money to spend on things other than cigarettes.  FIVE KEYS TO QUITTING Studies have shown that these 5 steps will help you quit smoking and quit for good. You have the best chances of quitting if you use them together: 1. Get ready.  2. Get support and encouragement.  3. Learn new skills and behaviors.  4. Get medicine to reduce your nicotine addiction and use it correctly.  5. Be prepared for relapse or difficult situations. Be determined to continue trying to quit, even if you do not succeed at first.  1. GET READY  Set a quit date.   Change your environment.   Get rid of ALL cigarettes, ashtrays, matches, and lighters in your home, car, and place of work.   Do not let people smoke in your home.   Review your past attempts to quit. Think about what worked and what did not.   Once you quit, do not smoke. NOT EVEN A PUFF!  2. GET SUPPORT AND ENCOURAGEMENT Studies have shown that you have a better chance of being successful if you have help. You can get support in many ways.  Tell   your family, friends, and coworkers that you are going to quit and need their support. Ask them not to smoke around you.   Talk to your caregivers (doctor, dentist, nurse, pharmacist, psychologist, and/or smoking counselor).   Get individual, group, or telephone counseling and support. The more counseling you have, the better your chances are of quitting. Programs are available at local hospitals and health centers. Call your local health department for information about programs in your area.   Spiritual beliefs and practices may help some smokers quit.   Quit meters are small computer programs online or downloadable that keep track of quit statistics, such as amount  of "quit-time," cigarettes not smoked, and money saved.   Many smokers find one or more of the many self-help books available useful in helping them quit and stay off tobacco.  3. LEARN NEW SKILLS AND BEHAVIORS  Try to distract yourself from urges to smoke. Talk to someone, go for a walk, or occupy your time with a task.   When you first try to quit, change your routine. Take a different route to work. Drink tea instead of coffee. Eat breakfast in a different place.   Do something to reduce your stress. Take a hot bath, exercise, or read a book.   Plan something enjoyable to do every day. Reward yourself for not smoking.   Explore interactive web-based programs that specialize in helping you quit.  4. GET MEDICINE AND USE IT CORRECTLY Medicines can help you stop smoking and decrease the urge to smoke. Combining medicine with the above behavioral methods and support can quadruple your chances of successfully quitting smoking. The U.S. Food and Drug Administration (FDA) has approved 7 medicines to help you quit smoking. These medicines fall into 3 categories.  Nicotine replacement therapy (delivers nicotine to your body without the negative effects and risks of smoking):   Nicotine gum: Available over-the-counter.   Nicotine lozenges: Available over-the-counter.   Nicotine inhaler: Available by prescription.   Nicotine nasal spray: Available by prescription.   Nicotine skin patches (transdermal): Available by prescription and over-the-counter.   Antidepressant medicine (helps people abstain from smoking, but how this works is unknown):   Bupropion sustained-release (SR) tablets: Available by prescription.   Nicotinic receptor partial agonist (simulates the effect of nicotine in your brain):   Varenicline tartrate tablets: Available by prescription.   Ask your caregiver for advice about which medicines to use and how to use them. Carefully read the information on the package.    Everyone who is trying to quit may benefit from using a medicine. If you are pregnant or trying to become pregnant, nursing an infant, you are under age 18, or you smoke fewer than 10 cigarettes per day, talk to your caregiver before taking any nicotine replacement medicines.   You should stop using a nicotine replacement product and call your caregiver if you experience nausea, dizziness, weakness, vomiting, fast or irregular heartbeat, mouth problems with the lozenge or gum, or redness or swelling of the skin around the patch that does not go away.   Do not use any other product containing nicotine while using a nicotine replacement product.   Talk to your caregiver before using these products if you have diabetes, heart disease, asthma, stomach ulcers, you had a recent heart attack, you have high blood pressure that is not controlled with medicine, a history of irregular heartbeat, or you have been prescribed medicine to help you quit smoking.  5. BE PREPARED FOR RELAPSE OR   DIFFICULT SITUATIONS  Most relapses occur within the first 3 months after quitting. Do not be discouraged if you start smoking again. Remember, most people try several times before they finally quit.   You may have symptoms of withdrawal because your body is used to nicotine. You may crave cigarettes, be irritable, feel very hungry, cough often, get headaches, or have difficulty concentrating.   The withdrawal symptoms are only temporary. They are strongest when you first quit, but they will go away within 10 to 14 days.  Here are some difficult situations to watch for:  Alcohol. Avoid drinking alcohol. Drinking lowers your chances of successfully quitting.   Caffeine. Try to reduce the amount of caffeine you consume. It also lowers your chances of successfully quitting.   Other smokers. Being around smoking can make you want to smoke. Avoid smokers.   Weight gain. Many smokers will gain weight when they quit, usually  less than 10 pounds. Eat a healthy diet and stay active. Do not let weight gain distract you from your main goal, quitting smoking. Some medicines that help you quit smoking may also help delay weight gain. You can always lose the weight gained after you quit.   Bad mood or depression. There are a lot of ways to improve your mood other than smoking.  If you are having problems with any of these situations, talk to your caregiver. SPECIAL SITUATIONS AND CONDITIONS Studies suggest that everyone can quit smoking. Your situation or condition can give you a special reason to quit.  Pregnant women/new mothers: By quitting, you protect your baby's health and your own.   Hospitalized patients: By quitting, you reduce health problems and help healing.   Heart attack patients: By quitting, you reduce your risk of a second heart attack.   Lung, head, and neck cancer patients: By quitting, you reduce your chance of a second cancer.   Parents of children and adolescents: By quitting, you protect your children from illnesses caused by secondhand smoke.  QUESTIONS TO THINK ABOUT Think about the following questions before you try to stop smoking. You may want to talk about your answers with your caregiver.  Why do you want to quit?   If you tried to quit in the past, what helped and what did not?   What will be the most difficult situations for you after you quit? How will you plan to handle them?   Who can help you through the tough times? Your family? Friends? Caregiver?   What pleasures do you get from smoking? What ways can you still get pleasure if you quit?  Here are some questions to ask your caregiver:  How can you help me to be successful at quitting?   What medicine do you think would be best for me and how should I take it?   What should I do if I need more help?   What is smoking withdrawal like? How can I get information on withdrawal?  Quitting takes hard work and a lot of effort,  but you can quit smoking. FOR MORE INFORMATION  Smokefree.gov (http://www.smokefree.gov) provides free, accurate, evidence-based information and professional assistance to help support the immediate and long-term needs of people trying to quit smoking. Document Released: 09/09/2001 Document Revised: 09/04/2011 Document Reviewed: 07/02/2009 [employer] Patient Information 2012 [employer]. 

## 2012-03-30 ENCOUNTER — Encounter: Payer: Self-pay | Admitting: Family Medicine
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
## 2012-03-30 NOTE — Assessment & Plan Note (Signed)
Continue Ambien prn  

## 2012-03-30 NOTE — Assessment & Plan Note (Signed)
Check viral load 

## 2012-03-30 NOTE — Assessment & Plan Note (Addendum)
Continue current regimen Fasting lipids.

## 2012-04-23 ENCOUNTER — Other Ambulatory Visit: Payer: BC Managed Care – PPO

## 2012-04-23 DIAGNOSIS — B1710 Acute hepatitis C without hepatic coma: Secondary | ICD-10-CM

## 2012-04-23 DIAGNOSIS — I2510 Atherosclerotic heart disease of native coronary artery without angina pectoris: Secondary | ICD-10-CM

## 2012-04-23 LAB — COMPREHENSIVE METABOLIC PANEL
AST: 37 U/L (ref 0–37)
Albumin: 4.1 g/dL (ref 3.5–5.2)
BUN: 10 mg/dL (ref 6–23)
Calcium: 9.3 mg/dL (ref 8.4–10.5)
Chloride: 107 mEq/L (ref 96–112)
Glucose, Bld: 84 mg/dL (ref 70–99)
Potassium: 4.6 mEq/L (ref 3.5–5.3)
Sodium: 143 mEq/L (ref 135–145)
Total Protein: 6.5 g/dL (ref 6.0–8.3)

## 2012-04-23 LAB — LIPID PANEL: Cholesterol: 156 mg/dL (ref 0–200)

## 2012-04-23 NOTE — Progress Notes (Signed)
LABS DRAWN FOR CMP & LIPID SENT TO [facility]

## 2012-04-27 LAB — HEPATITIS C RNA QUANTITATIVE: HCV Quantitative Log: 5.41 {Log} — ABNORMAL HIGH (ref <1.63)

## 2012-04-28 ENCOUNTER — Encounter: Payer: Self-pay | Admitting: Family Medicine

## 2012-05-19 ENCOUNTER — Telehealth: Payer: Self-pay | Admitting: Family Medicine

## 2012-05-19 NOTE — Telephone Encounter (Signed)
Called pharmacy and they did not get RX that was phoned in on 03/29/2012. I verbally gave RX to pharmacist. for Ambien 5 mg one at bedtime #90 tabs and 3 refills. Patient notified.

## 2012-05-19 NOTE — Telephone Encounter (Signed)
Pt is having trouble getting her ambien filled - pharm only gave her #30 tabs and now is telling her that her refills have expired.  They state they have sent request, but nothing in Dr Tawni Levy box. [facility], [place]

## 2012-07-12 ENCOUNTER — Other Ambulatory Visit: Payer: Self-pay | Admitting: Family Medicine

## 2012-09-29 ENCOUNTER — Other Ambulatory Visit: Payer: Self-pay | Admitting: Family Medicine

## 2012-10-06 ENCOUNTER — Encounter: Payer: Self-pay | Admitting: Family Medicine

## 2012-10-06 ENCOUNTER — Ambulatory Visit (INDEPENDENT_AMBULATORY_CARE_PROVIDER_SITE_OTHER): Payer: BC Managed Care – PPO | Admitting: Family Medicine

## 2012-10-06 VITALS — BP 107/76 | HR 101 | Temp 98.5°F | Ht 67.0 in | Wt 141.0 lb

## 2012-10-06 DIAGNOSIS — N764 Abscess of vulva: Secondary | ICD-10-CM | POA: Insufficient documentation

## 2012-10-06 DIAGNOSIS — M129 Arthropathy, unspecified: Secondary | ICD-10-CM

## 2012-10-06 DIAGNOSIS — M1990 Unspecified osteoarthritis, unspecified site: Secondary | ICD-10-CM

## 2012-10-06 MED ORDER — TRAMADOL HCL 50 MG PO TABS: 50.0000 mg | ORAL_TABLET | Freq: Four times a day (QID) | ORAL | Status: DC | PRN

## 2012-10-06 MED ORDER — DOXYCYCLINE HYCLATE 100 MG PO CAPS: 100.0000 mg | ORAL_CAPSULE | Freq: Two times a day (BID) | ORAL | Status: DC

## 2012-10-06 NOTE — Assessment & Plan Note (Signed)
Abx, sitz baths, vulvar health revealed.

## 2012-10-06 NOTE — Assessment & Plan Note (Signed)
Continue work with [employer] comp for this, I have refilled her Tramadol, mostly using 3/day.
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
## 2012-10-06 NOTE — Patient Instructions (Addendum)
Abscess An abscess is an infected area that contains a collection of pus and debris.It can occur in almost any part of the body. An abscess is also known as a furuncle or boil. CAUSES  An abscess occurs when tissue gets infected. This can occur from blockage of oil or sweat glands, infection of hair follicles, or a minor injury to the skin. As the body tries to fight the infection, pus collects in the area and creates pressure under the skin. This pressure causes pain. People with weakened immune systems have difficulty fighting infections and get certain abscesses more often.  SYMPTOMS Usually an abscess develops on the skin and becomes a painful mass that is red, warm, and tender. If the abscess forms under the skin, you may feel a moveable soft area under the skin. Some abscesses break open (rupture) on their own, but most will continue to get worse without care. The infection can spread deeper into the body and eventually into the bloodstream, causing you to feel ill.  DIAGNOSIS  Your caregiver will take your medical history and perform a physical exam. A sample of fluid may also be taken from the abscess to determine what is causing your infection. TREATMENT  Your caregiver may prescribe antibiotic medicines to fight the infection. However, taking antibiotics alone usually does not cure an abscess. Your caregiver may need to make a small cut (incision) in the abscess to drain the pus. In some cases, gauze is packed into the abscess to reduce pain and to continue draining the area. HOME CARE INSTRUCTIONS   Only take over-the-counter or prescription medicines for pain, discomfort, or fever as directed by your caregiver.  If you were prescribed antibiotics, take them as directed. Finish them even if you start to feel better.  If gauze is used, follow your caregiver's directions for changing the gauze.  To avoid spreading the infection:  Keep your draining abscess covered with a  bandage.  Wash your hands well.  Do not share personal care items, towels, or whirlpools with others.  Avoid skin contact with others.  Keep your skin and clothes clean around the abscess.  Keep all follow-up appointments as directed by your caregiver. SEEK MEDICAL CARE IF:   You have increased pain, swelling, redness, fluid drainage, or bleeding.  You have muscle aches, chills, or a general ill feeling.  You have a fever. MAKE SURE YOU:   Understand these instructions.  Will watch your condition.  Will get help right away if you are not doing well or get worse. Document Released: 06/25/2005 Document Revised: 03/16/2012 Document Reviewed: 11/28/2011 [facility] Patient Information 2013 [place], [place]. Sitz Bath A sitz bath is a warm water bath taken in the sitting position that covers only the hips and buttocks. It may be used for either healing or hygiene purposes. Sitz baths are also used to relieve pain, itching, or muscle spasms. The water may contain medicine. Moist heat will help you heal and relax.  HOME CARE INSTRUCTIONS   Fill the bathtub half full with warm water.  Sit in the water and open the drain a little.  Turn on the warm water to keep the tub half full. Keep the water running constantly.  Soak in the water for 15 to 20 minutes.  After the sitz bath, pat the affected area dry first.  Take 3 to 4 sitz baths a day. SEEK MEDICAL CARE IF:  You get worse instead of better. Stop the sitz baths if you get worse. MAKE  SURE YOU:  Understand these instructions.  Will watch your condition.  Will get help right away if you are not doing well or get worse. Document Released: 06/07/2004 Document Revised: 12/08/2011 Document Reviewed: 12/13/2010 [facility] Patient Information 2013 [place], [place].

## 2012-11-04 ENCOUNTER — Other Ambulatory Visit: Payer: Self-pay | Admitting: Family Medicine

## 2012-11-06 ENCOUNTER — Other Ambulatory Visit: Payer: Self-pay | Admitting: Family Medicine

## 2013-03-01 ENCOUNTER — Other Ambulatory Visit: Payer: Self-pay

## 2013-03-01 DIAGNOSIS — Z1231 Encounter for screening mammogram for malignant neoplasm of breast: Secondary | ICD-10-CM

## 2013-03-15 ENCOUNTER — Other Ambulatory Visit: Payer: Self-pay | Admitting: Family Medicine

## 2013-03-15 DIAGNOSIS — M1990 Unspecified osteoarthritis, unspecified site: Secondary | ICD-10-CM

## 2013-03-15 MED ORDER — TRAMADOL HCL 50 MG PO TABS: 50.0000 mg | ORAL_TABLET | Freq: Four times a day (QID) | ORAL | Status: DC | PRN

## 2013-03-15 NOTE — Telephone Encounter (Signed)
Sent Rx request to MD.  Radene Ou, CMA

## 2013-03-15 NOTE — Telephone Encounter (Signed)
Which Tramadol Rx is approved?  Taylor Walker, [name], CMA

## 2013-03-15 NOTE — Telephone Encounter (Signed)
Patient uses [facility] on [address] in [place] and contacted them last Wed, 6/11 for a refill on Tramadol, that they faxed over the refill request for, but have not heard back from the doctor on it.

## 2013-04-08 ENCOUNTER — Ambulatory Visit
Admission: RE | Admit: 2013-04-08 | Discharge: 2013-04-08 | Disposition: A | Discharge status: Home / Self Care | Payer: BC Managed Care – PPO | Source: Ambulatory Visit

## 2013-04-08 DIAGNOSIS — Z1231 Encounter for screening mammogram for malignant neoplasm of breast: Secondary | ICD-10-CM

## 2013-04-11 ENCOUNTER — Ambulatory Visit (INDEPENDENT_AMBULATORY_CARE_PROVIDER_SITE_OTHER): Payer: BC Managed Care – PPO | Admitting: Family Medicine

## 2013-04-11 ENCOUNTER — Encounter: Payer: Self-pay | Admitting: Family Medicine

## 2013-04-11 VITALS — BP 117/82 | HR 81 | Temp 97.8°F | Wt 144.0 lb

## 2013-04-11 DIAGNOSIS — B1710 Acute hepatitis C without hepatic coma: Secondary | ICD-10-CM

## 2013-04-11 DIAGNOSIS — G4700 Insomnia, unspecified: Secondary | ICD-10-CM

## 2013-04-11 DIAGNOSIS — R51 Headache: Secondary | ICD-10-CM | POA: Insufficient documentation

## 2013-04-11 DIAGNOSIS — R519 Headache, unspecified: Secondary | ICD-10-CM | POA: Insufficient documentation

## 2013-04-11 DIAGNOSIS — I2510 Atherosclerotic heart disease of native coronary artery without angina pectoris: Secondary | ICD-10-CM

## 2013-04-11 DIAGNOSIS — F17200 Nicotine dependence, unspecified, uncomplicated: Secondary | ICD-10-CM

## 2013-04-11 MED ORDER — ZOLPIDEM TARTRATE 5 MG PO TABS: 5.0000 mg | ORAL_TABLET | Freq: Every evening | ORAL | Status: DC | PRN

## 2013-04-11 NOTE — Patient Instructions (Addendum)
Taylor Walker,  Thank you very much for coming in today. It was a pleasure meeting you.   Regarding insomnia: #1. I will refill your Ambien. We'll increase the dose she may take 5 or 10 mg nightly as needed. #2 sleep hygiene: Remember that the benefit was only for sleep and sex. He has not fallen asleep within 20 minutes please leave the bedroom. He may go to another room in in your home. I recommended she read. I recommended she avoid the television of bright lights. Once he retired again please go back to her bedroom. Please turn all alarm clocks  Backwards.  Regarding smoking: Smoking cessation support: smoking cessation hotline: 1-800-QUIT-NOW.  Here is the number to the smoking cessation classes at [facility]: 161-0960  Filled out application for renewal of permanent disability parking placard today.   Please get records from [facility] regarding colonscopy and have them fax report to our office [phone].  I have placed a referral to [facility] we will need records from [facility] prior to scheduling appt.   Dr. Armen Pickup

## 2013-04-11 NOTE — Progress Notes (Signed)
Subjective:     Patient ID: Taylor Walker, female   DOB: Jan 29, 1956, 57 y.o.   MRN: [id number]  HPI 57 year old female with history of coronary artery disease, right total knee replacement, total abdominal hysterectomy for uterine fibroids, hepatitis C presents for physical:  #1 insomnia: Patient not sleeping well for the past 8 months. She reports she goes to bed at 9:30 but lays in bed awake until about 3 AM. She has taken Ambien 5 mg nightly but has been out for the past 3 months. She felt her sleep was better when she was taking Ambien. When she cannot fall asleep she watches TV. She does smoke cigarettes usually her last cigarette was at 7:30 PM. She denies substance abuse. Her job is sedentary. She works out 3 times a week. She does 100 pushups daily.  #2 smoking: patient smokes 3-4 cigarettes daily. She denies chronic cough. She is not ready to quit.  #3 headache: Patient reports that she woke up with a left-sided headache this morning. The headache is moderate. She is not taking anything for it. She denies associated vision changes, nausea, vomiting, weakness, numbness. She is not prone to headaches.  #4 Hepatitis C: Patient with colostomy history of hepatitis C. She's been treated in the past interferon. She is to followup in the past at [facility]. She has not been seen by hepatologist for the past 3 years.   # 5 hearing loss: patient has chronic bilateral hearing loss. She does not wear hearing aids. She does urinalysis getting worse. She is able to hear sirens and the car course.  #6 health maintenance: Patient had a normal mammogram this month. She no longer gets Pap smears 2/2 total abdominal hysterectomy. She had a normal colonoscopy and at the [place] October 24, 2012.  Review of Systems Patient Information Form: Screening and ROS  AUDIT-C Score: 0 Do you feel safe in relationships? yes PHQ-2:negative  Review of Symptoms  General:  Negative for nexplained weight loss,  fever Skin: Negative for new or changing mole, sore that won't heal HEENT: Positive for hearing loss. Negative for trouble seeing, ringing in ears, mouth sores, hoarseness, change in voice, dysphagia. CV:  Negative for chest pain, dyspnea, edema, palpitations Resp: Negative for cough, dyspnea, hemoptysis GI: Negative for nausea, vomiting, diarrhea, constipation, abdominal pain, melena, hematochezia. GU: Negative for dysuria, incontinence, urinary hesitance, hematuria, vaginal or penile discharge, polyuria, sexual difficulty, lumps in testicle or breasts MSK: Negative for muscle cramps or aches, joint pain or swelling Neuro: Negative for headaches, weakness, numbness, dizziness, passing out/fainting Psych: Negative for depression, anxiety, memory problems    Objective:   Physical Exam BP 117/82  Pulse 81  Temp(Src) 97.8 F (36.6 C) (Oral)  Wt 144 lb (65.318 kg)  BMI 22.55 kg/m2 General appearance: alert, cooperative and no distress Head: Normocephalic, without obvious abnormality, atraumatic Eyes: conjunctivae/corneas clear. PERRL, EOM's intact. Ears: normal TM's and external ear canals both ears Nose: no discharge, turbinates pink, swollen, no sinus tenderness Throat: lips, mucosa, and tongue normal; teeth and gums normal Lungs: clear to auscultation bilaterally Heart: regular rate and rhythm, S1, S2 normal, no murmur, click, rub or gallop Abdomen: soft, non-tender; bowel sounds normal; no masses,  no organomegaly Extremities: extremities normal, atraumatic, no cyanosis or edema Pulses: 2+ and symmetric Neurologic: Grossly normal    Assessment and Plan:

## 2013-04-11 NOTE — Assessment & Plan Note (Signed)
A: persistent. P:  Refilled Ambien, 5-10 mg nightly.  Discussed sleep hygiene.

## 2013-04-11 NOTE — Assessment & Plan Note (Signed)
A: asymptomatic. Not requiring NTG. P: Continue daily ASA Continue statin. Check lipid panel and CMP.

## 2013-04-11 NOTE — Assessment & Plan Note (Signed)
A: headache x one this AM. Possibly migraine. No treatment thus far. Normal exam. P: recommend tylenol/NSAID for treatment.  Work on improving sleep quality.

## 2013-04-11 NOTE — Assessment & Plan Note (Signed)
A: not ready to quit. P: Provided cessation resources.

## 2013-04-11 NOTE — Assessment & Plan Note (Signed)
Please refer to hepatitis C clinic for patient to have routine monitoring. I have ordered a hepatitis C genotype.

## 2013-04-29 ENCOUNTER — Other Ambulatory Visit: Payer: BC Managed Care – PPO

## 2013-04-29 DIAGNOSIS — I2510 Atherosclerotic heart disease of native coronary artery without angina pectoris: Secondary | ICD-10-CM

## 2013-04-29 DIAGNOSIS — B1710 Acute hepatitis C without hepatic coma: Secondary | ICD-10-CM

## 2013-04-29 LAB — COMPREHENSIVE METABOLIC PANEL
ALT: 40 U/L — ABNORMAL HIGH (ref 0–35)
AST: 33 U/L (ref 0–37)
Albumin: 4 g/dL (ref 3.5–5.2)
BUN: 12 mg/dL (ref 6–23)
Calcium: 9.1 mg/dL (ref 8.4–10.5)
Chloride: 105 mEq/L (ref 96–112)
Potassium: 4.2 mEq/L (ref 3.5–5.3)
Sodium: 139 mEq/L (ref 135–145)
Total Protein: 6.7 g/dL (ref 6.0–8.3)

## 2013-04-29 LAB — LIPID PANEL
HDL: 69 mg/dL (ref >39)
LDL Cholesterol: 64 mg/dL (ref 0–99)

## 2013-04-29 NOTE — Progress Notes (Signed)
We drew CMP, Lipid and Hep C genotype. Taylor Walker, Taylor Walker

## 2013-05-03 LAB — HEPATITIS C RNA QUANTITATIVE
HCV Quantitative Log: 5.72 {Log} — ABNORMAL HIGH (ref <1.18)
HCV Quantitative: 519000 IU/mL — ABNORMAL HIGH (ref <15)

## 2013-05-04 LAB — HEPATITIS C GENOTYPE

## 2013-05-16 ENCOUNTER — Encounter: Payer: Self-pay | Admitting: Family Medicine

## 2013-06-15 ENCOUNTER — Other Ambulatory Visit: Payer: Self-pay | Admitting: Internal Medicine

## 2013-06-15 DIAGNOSIS — C220 Liver cell carcinoma: Secondary | ICD-10-CM

## 2013-06-24 ENCOUNTER — Ambulatory Visit
Admission: RE | Admit: 2013-06-24 | Discharge: 2013-06-24 | Disposition: A | Discharge status: Home / Self Care | Payer: BC Managed Care – PPO | Source: Ambulatory Visit | Attending: Internal Medicine | Admitting: Internal Medicine

## 2013-06-24 DIAGNOSIS — C220 Liver cell carcinoma: Secondary | ICD-10-CM

## 2013-10-17 ENCOUNTER — Other Ambulatory Visit: Payer: Self-pay | Admitting: Family Medicine

## 2013-10-17 NOTE — Telephone Encounter (Signed)
Pt is aware of this. Taylor Walker,CMA  

## 2013-10-17 NOTE — Telephone Encounter (Signed)
Medication called in. [facility]

## 2013-12-02 ENCOUNTER — Other Ambulatory Visit: Payer: Self-pay | Admitting: Family Medicine

## 2013-12-16 ENCOUNTER — Telehealth: Payer: Self-pay | Admitting: Family Medicine

## 2013-12-16 MED ORDER — TRAMADOL HCL 50 MG PO TABS: 50.0000 mg | ORAL_TABLET | Freq: Four times a day (QID) | ORAL | Status: DC | PRN

## 2013-12-16 NOTE — Telephone Encounter (Signed)
Pt is aware that rx was called in. Jazmin Hartsell,CMA  

## 2013-12-16 NOTE — Telephone Encounter (Signed)
LMOVM for pt to return call.  Please inform of below message and ask what her preference is? Abelardo Seidner, [name]    ----- Message from [name], MD sent at 12/16/2013 11:50 AM ----- Ask if we can call in to pharmacy please?--I am not by there until next week.

## 2013-12-16 NOTE — Telephone Encounter (Signed)
Pt called and would like a refill on her Tramadol left up front for pickup. jw

## 2013-12-16 NOTE — Telephone Encounter (Signed)
rx can be called in to [facility] on [address]

## 2014-01-31 ENCOUNTER — Other Ambulatory Visit: Payer: Self-pay | Admitting: Family Medicine

## 2014-03-07 ENCOUNTER — Encounter: Payer: Self-pay | Admitting: Family Medicine

## 2014-03-07 ENCOUNTER — Ambulatory Visit (INDEPENDENT_AMBULATORY_CARE_PROVIDER_SITE_OTHER): Payer: Worker's Compensation | Admitting: Family Medicine

## 2014-03-07 VITALS — BP 114/74 | HR 96 | Temp 99.1°F | Ht 67.0 in | Wt 149.0 lb

## 2014-03-07 DIAGNOSIS — T63301A Toxic effect of unspecified spider venom, accidental (unintentional), initial encounter: Secondary | ICD-10-CM | POA: Insufficient documentation

## 2014-03-07 DIAGNOSIS — T6391XA Toxic effect of contact with unspecified venomous animal, accidental (unintentional), initial encounter: Secondary | ICD-10-CM

## 2014-03-07 DIAGNOSIS — Z23 Encounter for immunization: Secondary | ICD-10-CM

## 2014-03-07 DIAGNOSIS — B1710 Acute hepatitis C without hepatic coma: Secondary | ICD-10-CM

## 2014-03-07 DIAGNOSIS — T63391A Toxic effect of venom of other spider, accidental (unintentional), initial encounter: Secondary | ICD-10-CM

## 2014-03-07 MED ORDER — TRAMADOL HCL 50 MG PO TABS: 50.0000 mg | ORAL_TABLET | Freq: Four times a day (QID) | ORAL | Status: DC | PRN

## 2014-03-07 MED ORDER — TETANUS-DIPHTH-ACELL PERTUSSIS 5-2.5-18.5 LF-MCG/0.5 IM SUSP
0.5000 mL | Freq: Once | INTRAMUSCULAR | Status: DC
Start: 2014-03-07 — End: 2014-03-07

## 2014-03-07 NOTE — Progress Notes (Signed)
  This visit is related to a workman's comp case Subjective:    Patient ID: Taylor Walker, female    DOB: [birth year], 58 y.o.   MRN: [id number] CC: insect bite on 02/15/14.  HPI 58 yo F presents for f/u of R calf insect bite on 02/15/14. This occurred at work. She immediately noticed pain, ecchymoses, swelling. She was evaluated and treated at [facility]. She was prescribed a 10 day course of doxycycline and a prednisone taper. Since the the bruising has resolved. She had diffuse leg cramping on night. Still having tenderness and swelling. No fever or chills.   Soc hx: non smoker Med hx: hep C  Review of Systems As per HPI     Objective:   Physical Exam BP 114/74  Pulse 96  Temp(Src) 99.1 F (37.3 C) (Oral)  Ht 5\' 7"  (1.702 m)  Wt 149 lb (67.586 kg)  BMI 23.33 kg/m2 General appearance: alert, cooperative and no distress Extremities: calf w/o ecchymoses. Slightly more prominent venous pattern.  R calf with fullness and tenderness compared to the L. No palpable cords. Intact DP pulses. No R inguinal adenopathy.     Assessment & Plan:   Patient given tetanus shot.

## 2014-03-07 NOTE — Patient Instructions (Addendum)
Taylor Walker,  Thank you for coming in today. I am sorry to hear about your bite. It does sound like a black widow given rapid onset of bruising and muscle leg spasm.  You are on the right track towards healing.  Please continue to elevate as much as possible and ice for 20 minutes 2-3 times daily. Continue tramadol as needed for pain. Elevate legs as much as possible.   Call and come in for fever, chills, worsening swelling.   You will be called about your referral.   Dr. Sharion Settler Widow Spider Bite The adult female black widow spider has a black body about  inch (1.2 centimeters) long. This spider has an orange-red hourglass shape on her belly. Black widow spider bites can be serious and life-threatening. HOME CARE  Do not scratch the bite. Keep the bite clean and covered with a bandage.  Wash the bite 3 times a day with warm, soapy water, or as told by your doctor.  Put ice on the bite.  Put ice in a plastic bag.  Place a towel between your skin and the bag.  Leave the ice on for 20 minutes, 4 times a day. Do this for the first 2 days, or as told by your doctor.  Keep the bite raised (elevated) above the level of your heart.  Only take medicines as told by your doctor.  Watch the bite for pain, puffiness (swelling), redness, or yellowish-white fluid (pus). You may need a tetanus shot if:  You cannot remember when you had your last tetanus shot.  You have never had a tetanus shot.  The injury broke your skin. If you need a tetanus shot and you choose not to have one, you may get tetanus. Sickness from tetanus can be serious. GET HELP RIGHT AWAY IF:  You have muscle cramps or belly (abdominal) pain or cramps.  You are breathing fast or have a fast heartbeat (pulse).  You feel restless or confused.  You have chest pain.  You feel lightheaded or generally sick (malaise).  You have pain, soreness, redness, puffiness, or fluid coming from the bite.  You  have a fever. MAKE SURE YOU:   Understand these instructions.  Will watch your condition.  Will get help right away if you are not doing well or get worse. Document Released: 09/04/2011 Document Revised: 12/08/2011 Document Reviewed: 09/04/2011 [facility] Patient Information 2014 [place], [place].

## 2014-03-07 NOTE — Assessment & Plan Note (Signed)
I am sorry to hear about your bite. It does sound like a black widow given rapid onset of bruising and muscle leg spasm.  You are on the right track towards healing.  Please continue to elevate as much as possible and ice for 20 minutes 2-3 times daily. Continue tramadol as needed for pain. Elevate legs as much as possible.   Call and come in for fever, chills, worsening swelling.

## 2014-03-07 NOTE — Assessment & Plan Note (Signed)
A: patient went to Hep C clinic last year. Was told that she was a good candidate for treatment. Did not return. Interested in going back. P: referral to ID clinic.

## 2014-03-08 ENCOUNTER — Other Ambulatory Visit: Payer: Self-pay

## 2014-03-08 DIAGNOSIS — Z1231 Encounter for screening mammogram for malignant neoplasm of breast: Secondary | ICD-10-CM

## 2014-03-22 ENCOUNTER — Other Ambulatory Visit: Payer: Self-pay

## 2014-03-29 ENCOUNTER — Other Ambulatory Visit: Payer: Self-pay | Admitting: Nurse Practitioner

## 2014-03-29 DIAGNOSIS — C220 Liver cell carcinoma: Secondary | ICD-10-CM

## 2014-03-30 ENCOUNTER — Telehealth: Payer: Self-pay | Admitting: Family Medicine

## 2014-03-30 NOTE — Telephone Encounter (Signed)
Called to give pt the number, but she already found the info. Taylor Walker, [name]

## 2014-03-30 NOTE — Telephone Encounter (Signed)
Needs phone number for the place she is scheduled to adomiable scan She is having a liver scan in [place] and doesn't want to duplicate it  Please advise

## 2014-04-05 ENCOUNTER — Encounter: Payer: Self-pay | Admitting: Family Medicine

## 2014-04-12 ENCOUNTER — Ambulatory Visit
Admission: RE | Admit: 2014-04-12 | Discharge: 2014-04-12 | Disposition: A | Discharge status: Home / Self Care | Payer: BC Managed Care – PPO | Source: Ambulatory Visit

## 2014-04-12 ENCOUNTER — Ambulatory Visit (INDEPENDENT_AMBULATORY_CARE_PROVIDER_SITE_OTHER): Payer: BC Managed Care – PPO | Admitting: Family Medicine

## 2014-04-12 ENCOUNTER — Encounter: Payer: Self-pay | Admitting: Family Medicine

## 2014-04-12 VITALS — BP 122/78 | HR 89 | Temp 97.9°F | Ht 68.0 in | Wt 147.1 lb

## 2014-04-12 DIAGNOSIS — Z23 Encounter for immunization: Secondary | ICD-10-CM

## 2014-04-12 DIAGNOSIS — G4700 Insomnia, unspecified: Secondary | ICD-10-CM

## 2014-04-12 DIAGNOSIS — M129 Arthropathy, unspecified: Secondary | ICD-10-CM

## 2014-04-12 DIAGNOSIS — I2510 Atherosclerotic heart disease of native coronary artery without angina pectoris: Secondary | ICD-10-CM

## 2014-04-12 DIAGNOSIS — Z1231 Encounter for screening mammogram for malignant neoplasm of breast: Secondary | ICD-10-CM

## 2014-04-12 DIAGNOSIS — Z0000 Encounter for general adult medical examination without abnormal findings: Secondary | ICD-10-CM

## 2014-04-12 DIAGNOSIS — M1990 Unspecified osteoarthritis, unspecified site: Secondary | ICD-10-CM

## 2014-04-12 DIAGNOSIS — B1710 Acute hepatitis C without hepatic coma: Secondary | ICD-10-CM

## 2014-04-12 DIAGNOSIS — F17200 Nicotine dependence, unspecified, uncomplicated: Secondary | ICD-10-CM

## 2014-04-12 MED ORDER — TRAMADOL HCL 50 MG PO TABS: 50.0000 mg | ORAL_TABLET | Freq: Four times a day (QID) | ORAL | Status: DC | PRN

## 2014-04-12 NOTE — Assessment & Plan Note (Signed)
Had recent scan in [place]--is a good candidate for treatment.

## 2014-04-12 NOTE — Assessment & Plan Note (Signed)
Smokes 3-4 cigarettes/day--uninterested in quitting at this time.

## 2014-04-12 NOTE — Assessment & Plan Note (Signed)
Using OTC meds as Ambien and Unisom give strange nightmares.

## 2014-04-12 NOTE — Assessment & Plan Note (Signed)
Under good control.  Continue current meds and f/u with cards.

## 2014-04-12 NOTE — Assessment & Plan Note (Signed)
Refill of tramadol.

## 2014-04-12 NOTE — Progress Notes (Signed)
    Subjective:    Patient ID: Taylor Walker is a 58 y.o. female presenting with Annual Exam  on 04/12/2014  HPI: Here for CPE. Doing well.  She is getting ready to begin treatment for Hep C. Seen by cardiology recently and was doing well. Had mammogram today.  Reports Colonoscopy 1/15 in [place]--need records. Has dental care beginning in 12/15.  Review of Systems  Constitutional: Negative for fever and chills.  HENT: Negative for congestion, nosebleeds and rhinorrhea.   Eyes: Negative for visual disturbance.  Respiratory: Negative for chest tightness and shortness of breath.   Cardiovascular: Negative for chest pain.  Gastrointestinal: Negative for nausea, vomiting, abdominal pain, diarrhea, constipation, blood in stool and abdominal distention.  Genitourinary: Negative for dysuria, frequency, vaginal bleeding and menstrual problem.  Musculoskeletal: Negative for arthralgias.  Skin: Negative for rash.  Neurological: Negative for dizziness and headaches.  Psychiatric/Behavioral: Negative for behavioral problems, sleep disturbance and dysphoric mood.  All other systems reviewed and are negative.     Objective:    BP 122/78  Pulse 89  Temp(Src) 97.9 F (36.6 C) (Oral)  Ht 5\' 8"  (1.727 m)  Wt 147 lb 1.6 oz (66.724 kg)  BMI 22.37 kg/m2 Physical Exam  Constitutional: She is oriented to person, place, and time. She appears well-developed and well-nourished.  HENT:  Head: Normocephalic and atraumatic.  Eyes: Pupils are equal, round, and reactive to light. No scleral icterus.  Neck: Normal range of motion. No thyromegaly present.  Cardiovascular: Normal rate, regular rhythm and intact distal pulses.   Pulmonary/Chest: Effort normal and breath sounds normal.  Abdominal: Soft. She exhibits no distension. There is no tenderness.  Neurological: She is alert and oriented to person, place, and time.  Skin: Skin is warm and dry.        Assessment & Plan:   Problem List Items  Addressed This Visit     Unprioritized   HEPATITIS C - Primary     Had recent scan in [place]--is a good candidate for treatment.    TOBACCO USE DISORDER/SMOKER-SMOKING CESSATION DISCUSSED     Smokes 3-4 cigarettes/day--uninterested in quitting at this time.    CORONARY ARTERY DISEASE     Under good control.  Continue current meds and f/u with cards.    Arthritis     Refill of tramadol.    Relevant Medications      traMADol (ULTRAM) 50 MG tablet   Insomnia     Using OTC meds as Ambien and Unisom give strange nightmares.     Other Visit Diagnoses   Need for prophylactic vaccination against Streptococcus pneumoniae (pneumococcus)        Relevant Orders       Pneumococcal polysaccharide vaccine 23-valent greater than or equal to 2yo subcutaneous/IM (Completed)       Return in about 1 year (around 04/13/2015) for a follow-up.

## 2014-04-12 NOTE — Patient Instructions (Addendum)
Preventive Care for Adults A healthy lifestyle and preventive care can promote health and wellness. Preventive health guidelines for women include the following key practices.  A routine yearly physical is a good way to check with your health care provider about your health and preventive screening. It is a chance to share any concerns and updates on your health and to receive a thorough exam.  Visit your dentist for a routine exam and preventive care every 6 months. Brush your teeth twice a day and floss once a day. Good oral hygiene prevents tooth decay and gum disease.  The frequency of eye exams is based on your age, health, family medical history, use of contact lenses, and other factors. Follow your health care provider's recommendations for frequency of eye exams.  Eat a healthy diet. Foods like vegetables, fruits, whole grains, low-fat dairy products, and lean protein foods contain the nutrients you need without too many calories. Decrease your intake of foods high in solid fats, added sugars, and salt. Eat the right amount of calories for you.Get information about a proper diet from your health care provider, if necessary.  Regular physical exercise is one of the most important things you can do for your health. Most adults should get at least 150 minutes of moderate-intensity exercise (any activity that increases your heart rate and causes you to sweat) each week. In addition, most adults need muscle-strengthening exercises on 2 or more days a week.  Maintain a healthy weight. The body mass index (BMI) is a screening tool to identify possible weight problems. It provides an estimate of body fat based on height and weight. Your health care provider can find your BMI, and can help you achieve or maintain a healthy weight.For adults 20 years and older:  A BMI below 18.5 is considered underweight.  A BMI of 18.5 to 24.9 is normal.  A BMI of 25 to 29.9 is considered overweight.  A BMI of  30 and above is considered obese.  Maintain normal blood lipids and cholesterol levels by exercising and minimizing your intake of saturated fat. Eat a balanced diet with plenty of fruit and vegetables. Blood tests for lipids and cholesterol should begin at age 52 and be repeated every 5 years. If your lipid or cholesterol levels are high, you are over 50, or you are at high risk for heart disease, you may need your cholesterol levels checked more frequently.Ongoing high lipid and cholesterol levels should be treated with medicines if diet and exercise are not working.  If you smoke, find out from your health care provider how to quit. If you do not use tobacco, do not start.  Lung cancer screening is recommended for adults aged 37-80 years who are at high risk for developing lung cancer because of a history of smoking. A yearly low-dose CT scan of the lungs is recommended for people who have at least a 30-pack-year history of smoking and are a current smoker or have quit within the past 15 years. A pack year of smoking is smoking an average of 1 pack of cigarettes a day for 1 year (for example: 1 pack a day for 30 years or 2 packs a day for 15 years). Yearly screening should continue until the smoker has stopped smoking for at least 15 years. Yearly screening should be stopped for people who develop a health problem that would prevent them from having lung cancer treatment.  If you are pregnant, do not drink alcohol. If you are breastfeeding,  be very cautious about drinking alcohol. If you are not pregnant and choose to drink alcohol, do not have more than 1 drink per day. One drink is considered to be 12 ounces (355 mL) of beer, 5 ounces (148 mL) of wine, or 1.5 ounces (44 mL) of liquor.  Avoid use of street drugs. Do not share needles with anyone. Ask for help if you need support or instructions about stopping the use of drugs.  High blood pressure causes heart disease and increases the risk of  stroke. Your blood pressure should be checked at least every 1 to 2 years. Ongoing high blood pressure should be treated with medicines if weight loss and exercise do not work.  If you are 75-52 years old, ask your health care provider if you should take aspirin to prevent strokes.  Diabetes screening involves taking a blood sample to check your fasting blood sugar level. This should be done once every 3 years, after age 15, if you are within normal weight and without risk factors for diabetes. Testing should be considered at a younger age or be carried out more frequently if you are overweight and have at least 1 risk factor for diabetes.  Breast cancer screening is essential preventive care for women. You should practice "breast self-awareness." This means understanding the normal appearance and feel of your breasts and may include breast self-examination. Any changes detected, no matter how small, should be reported to a health care provider. Women in their 58s and 30s should have a clinical breast exam (CBE) by a health care provider as part of a regular health exam every 1 to 3 years. After age 16, women should have a CBE every year. Starting at age 53, women should consider having a mammogram (breast X-ray test) every year. Women who have a family history of breast cancer should talk to their health care provider about genetic screening. Women at a high risk of breast cancer should talk to their health care providers about having an MRI and a mammogram every year.  Breast cancer gene (BRCA)-related cancer risk assessment is recommended for women who have family members with BRCA-related cancers. BRCA-related cancers include breast, ovarian, tubal, and peritoneal cancers. Having family members with these cancers may be associated with an increased risk for harmful changes (mutations) in the breast cancer genes BRCA1 and BRCA2. Results of the assessment will determine the need for genetic counseling and  BRCA1 and BRCA2 testing.  Routine pelvic exams to screen for cancer are no longer recommended for nonpregnant women who are considered low risk for cancer of the pelvic organs (ovaries, uterus, and vagina) and who do not have symptoms. Ask your health care provider if a screening pelvic exam is right for you.  If you have had past treatment for cervical cancer or a condition that could lead to cancer, you need Pap tests and screening for cancer for at least 20 years after your treatment. If Pap tests have been discontinued, your risk factors (such as having a new sexual partner) need to be reassessed to determine if screening should be resumed. Some women have medical problems that increase the chance of getting cervical cancer. In these cases, your health care provider may recommend more frequent screening and Pap tests.  The HPV test is an additional test that may be used for cervical cancer screening. The HPV test looks for the virus that can cause the cell changes on the cervix. The cells collected during the Pap test can be  tested for HPV. The HPV test could be used to screen women aged 47 years and older, and should be used in women of any age who have unclear Pap test results. After the age of 36, women should have HPV testing at the same frequency as a Pap test.  Colorectal cancer can be detected and often prevented. Most routine colorectal cancer screening begins at the age of 38 years and continues through age 58 years. However, your health care provider may recommend screening at an earlier age if you have risk factors for colon cancer. On a yearly basis, your health care provider may provide home test kits to check for hidden blood in the stool. Use of a small camera at the end of a tube, to directly examine the colon (sigmoidoscopy or colonoscopy), can detect the earliest forms of colorectal cancer. Talk to your health care provider about this at age 64, when routine screening begins. Direct  exam of the colon should be repeated every 5-10 years through age 21 years, unless early forms of pre-cancerous polyps or small growths are found.  People who are at an increased risk for hepatitis B should be screened for this virus. You are considered at high risk for hepatitis B if:  You were born in a country where hepatitis B occurs often. Talk with your health care provider about which countries are considered high risk.  Your parents were born in a high-risk country and you have not received a shot to protect against hepatitis B (hepatitis B vaccine).  You have HIV or AIDS.  You use needles to inject street drugs.  You live with, or have sex with, someone who has Hepatitis B.  You get hemodialysis treatment.  You take certain medicines for conditions like cancer, organ transplantation, and autoimmune conditions.  Hepatitis C blood testing is recommended for all people born from [birth year] through 1965 and any individual with known risks for hepatitis C.  Practice safe sex. Use condoms and avoid high-risk sexual practices to reduce the spread of sexually transmitted infections (STIs). STIs include gonorrhea, chlamydia, syphilis, trichomonas, herpes, HPV, and human immunodeficiency virus (HIV). Herpes, HIV, and HPV are viral illnesses that have no cure. They can result in disability, cancer, and death.  You should be screened for sexually transmitted illnesses (STIs) including gonorrhea and chlamydia if:  You are sexually active and are younger than 24 years.  You are older than 24 years and your health care provider tells you that you are at risk for this type of infection.  Your sexual activity has changed since you were last screened and you are at an increased risk for chlamydia or gonorrhea. Ask your health care provider if you are at risk.  If you are at risk of being infected with HIV, it is recommended that you take a prescription medicine daily to prevent HIV infection. This is  called preexposure prophylaxis (PrEP). You are considered at risk if:  You are a heterosexual woman, are sexually active, and are at increased risk for HIV infection.  You take drugs by injection.  You are sexually active with a partner who has HIV.  Talk with your health care provider about whether you are at high risk of being infected with HIV. If you choose to begin PrEP, you should first be tested for HIV. You should then be tested every 3 months for as long as you are taking PrEP.  Osteoporosis is a disease in which the bones lose minerals and strength  with aging. This can result in serious bone fractures or breaks. The risk of osteoporosis can be identified using a bone density scan. Women ages 65 years and over and women at risk for fractures or osteoporosis should discuss screening with their health care providers. Ask your health care provider whether you should take a calcium supplement or vitamin D to reduce the rate of osteoporosis.  Menopause can be associated with physical symptoms and risks. Hormone replacement therapy is available to decrease symptoms and risks. You should talk to your health care provider about whether hormone replacement therapy is right for you.  Use sunscreen. Apply sunscreen liberally and repeatedly throughout the day. You should seek shade when your shadow is shorter than you. Protect yourself by wearing long sleeves, pants, a wide-brimmed hat, and sunglasses year round, whenever you are outdoors.  Once a month, do a whole body skin exam, using a mirror to look at the skin on your back. Tell your health care provider of new moles, moles that have irregular borders, moles that are larger than a pencil eraser, or moles that have changed in shape or color.  Stay current with required vaccines (immunizations).  Influenza vaccine. All adults should be immunized every year.  Tetanus, diphtheria, and acellular pertussis (Td, Tdap) vaccine. Pregnant women should  receive 1 dose of Tdap vaccine during each pregnancy. The dose should be obtained regardless of the length of time since the last dose. Immunization is preferred during the 27th-36th week of gestation. An adult who has not previously received Tdap or who does not know her vaccine status should receive 1 dose of Tdap. This initial dose should be followed by tetanus and diphtheria toxoids (Td) booster doses every 10 years. Adults with an unknown or incomplete history of completing a 3-dose immunization series with Td-containing vaccines should begin or complete a primary immunization series including a Tdap dose. Adults should receive a Td booster every 10 years.  Varicella vaccine. An adult without evidence of immunity to varicella should receive 2 doses or a second dose if she has previously received 1 dose. Pregnant females who do not have evidence of immunity should receive the first dose after pregnancy. This first dose should be obtained before leaving the health care facility. The second dose should be obtained 4-8 weeks after the first dose.  Human papillomavirus (HPV) vaccine. Females aged 13-26 years who have not received the vaccine previously should obtain the 3-dose series. The vaccine is not recommended for use in pregnant females. However, pregnancy testing is not needed before receiving a dose. If a female is found to be pregnant after receiving a dose, no treatment is needed. In that case, the remaining doses should be delayed until after the pregnancy. Immunization is recommended for any person with an immunocompromised condition through the age of 26 years if she did not get any or all doses earlier. During the 3-dose series, the second dose should be obtained 4-8 weeks after the first dose. The third dose should be obtained 24 weeks after the first dose and 16 weeks after the second dose.  Zoster vaccine. One dose is recommended for adults aged 60 years or older unless certain conditions are  present.  Measles, mumps, and rubella (MMR) vaccine. Adults born before 1957 generally are considered immune to measles and mumps. Adults born in 1957 or later should have 1 or more doses of MMR vaccine unless there is a contraindication to the vaccine or there is laboratory evidence of immunity to   each of the three diseases. A routine second dose of MMR vaccine should be obtained at least 28 days after the first dose for students attending postsecondary schools, health care workers, or international travelers. People who received inactivated measles vaccine or an unknown type of measles vaccine during 1963-1967 should receive 2 doses of MMR vaccine. People who received inactivated mumps vaccine or an unknown type of mumps vaccine before 1979 and are at high risk for mumps infection should consider immunization with 2 doses of MMR vaccine. For females of childbearing age, rubella immunity should be determined. If there is no evidence of immunity, females who are not pregnant should be vaccinated. If there is no evidence of immunity, females who are pregnant should delay immunization until after pregnancy. Unvaccinated health care workers born before 1957 who lack laboratory evidence of measles, mumps, or rubella immunity or laboratory confirmation of disease should consider measles and mumps immunization with 2 doses of MMR vaccine or rubella immunization with 1 dose of MMR vaccine.  Pneumococcal 13-valent conjugate (PCV13) vaccine. When indicated, a person who is uncertain of her immunization history and has no record of immunization should receive the PCV13 vaccine. An adult aged 19 years or older who has certain medical conditions and has not been previously immunized should receive 1 dose of PCV13 vaccine. This PCV13 should be followed with a dose of pneumococcal polysaccharide (PPSV23) vaccine. The PPSV23 vaccine dose should be obtained at least 8 weeks after the dose of PCV13 vaccine. An adult aged 19  years or older who has certain medical conditions and previously received 1 or more doses of PPSV23 vaccine should receive 1 dose of PCV13. The PCV13 vaccine dose should be obtained 1 or more years after the last PPSV23 vaccine dose.  Pneumococcal polysaccharide (PPSV23) vaccine. When PCV13 is also indicated, PCV13 should be obtained first. All adults aged 65 years and older should be immunized. An adult younger than age 65 years who has certain medical conditions should be immunized. Any person who resides in a nursing home or long-term care facility should be immunized. An adult smoker should be immunized. People with an immunocompromised condition and certain other conditions should receive both PCV13 and PPSV23 vaccines. People with human immunodeficiency virus (HIV) infection should be immunized as soon as possible after diagnosis. Immunization during chemotherapy or radiation therapy should be avoided. Routine use of PPSV23 vaccine is not recommended for American Indians, [place] Natives, or people younger than 65 years unless there are medical conditions that require PPSV23 vaccine. When indicated, people who have unknown immunization and have no record of immunization should receive PPSV23 vaccine. One-time revaccination 5 years after the first dose of PPSV23 is recommended for people aged 19-64 years who have chronic kidney failure, nephrotic syndrome, asplenia, or immunocompromised conditions. People who received 1-2 doses of PPSV23 before age 65 years should receive another dose of PPSV23 vaccine at age 65 years or later if at least 5 years have passed since the previous dose. Doses of PPSV23 are not needed for people immunized with PPSV23 at or after age 65 years.  Meningococcal vaccine. Adults with asplenia or persistent complement component deficiencies should receive 2 doses of quadrivalent meningococcal conjugate (MenACWY-D) vaccine. The doses should be obtained at least 2 months apart.  Microbiologists working with certain meningococcal bacteria, military recruits, people at risk during an outbreak, and people who travel to or live in countries with a high rate of meningitis should be immunized. A first-year college student up through age   21 years who is living in a residence hall should receive a dose if she did not receive a dose on or after her 16th birthday. Adults who have certain high-risk conditions should receive one or more doses of vaccine.  Hepatitis A vaccine. Adults who wish to be protected from this disease, have certain high-risk conditions, work with hepatitis A-infected animals, work in hepatitis A research labs, or travel to or work in countries with a high rate of hepatitis A should be immunized. Adults who were previously unvaccinated and who anticipate close contact with an international adoptee during the first 60 days after arrival in the [place] from a country with a high rate of hepatitis A should be immunized.  Hepatitis B vaccine. Adults who wish to be protected from this disease, have certain high-risk conditions, may be exposed to blood or other infectious body fluids, are household contacts or sex partners of hepatitis B positive people, are clients or workers in certain care facilities, or travel to or work in countries with a high rate of hepatitis B should be immunized.  Haemophilus influenzae type b (Hib) vaccine. A previously unvaccinated person with asplenia or sickle cell disease or having a scheduled splenectomy should receive 1 dose of Hib vaccine. Regardless of previous immunization, a recipient of a hematopoietic stem cell transplant should receive a 3-dose series 6-12 months after her successful transplant. Hib vaccine is not recommended for adults with HIV infection. Preventive Services / Frequency Ages 43 to 39years  Blood pressure check.** / Every 1 to 2 years.  Lipid and cholesterol check.** / Every 5 years beginning at age  75.  Clinical breast exam.** / Every 3 years for women in their 32s and 74s.  BRCA-related cancer risk assessment.** / For women who have family members with a BRCA-related cancer (breast, ovarian, tubal, or peritoneal cancers).  Pap test.** / Every 2 years from ages 65 through 91. Every 3 years starting at age 34 through age 93 or 72 with a history of 3 consecutive normal Pap tests.  HPV screening.** / Every 3 years from ages 46 through ages 53 to 26 with a history of 3 consecutive normal Pap tests.  Hepatitis C blood test.** / For any individual with known risks for hepatitis C.  Skin self-exam. / Monthly.  Influenza vaccine. / Every year.  Tetanus, diphtheria, and acellular pertussis (Tdap, Td) vaccine.** / Consult your health care provider. Pregnant women should receive 1 dose of Tdap vaccine during each pregnancy. 1 dose of Td every 10 years.  Varicella vaccine.** / Consult your health care provider. Pregnant females who do not have evidence of immunity should receive the first dose after pregnancy.  HPV vaccine. / 3 doses over 6 months, if 70 and younger. The vaccine is not recommended for use in pregnant females. However, pregnancy testing is not needed before receiving a dose.  Measles, mumps, rubella (MMR) vaccine.** / You need at least 1 dose of MMR if you were born in 1957 or later. You may also need a 2nd dose. For females of childbearing age, rubella immunity should be determined. If there is no evidence of immunity, females who are not pregnant should be vaccinated. If there is no evidence of immunity, females who are pregnant should delay immunization until after pregnancy.  Pneumococcal 13-valent conjugate (PCV13) vaccine.** / Consult your health care provider.  Pneumococcal polysaccharide (PPSV23) vaccine.** / 1 to 2 doses if you smoke cigarettes or if you have certain conditions.  Meningococcal vaccine.** /  1 dose if you are age 70 to 51 years and a [place] living in a residence hall, or have one of several medical conditions, you need to get vaccinated against meningococcal disease. You may also need additional booster doses.  Hepatitis A vaccine.** / Consult your health care provider.  Hepatitis B vaccine.** / Consult your health care provider.  Haemophilus influenzae type b (Hib) vaccine.** / Consult your health care provider. Ages 40 to 64years  Blood pressure check.** / Every 1 to 2 years.  Lipid and cholesterol check.** / Every 5 years beginning at age 58 years.  Lung cancer screening. / Every year if you are aged 56-80 years and have a 30-pack-year history of smoking and currently smoke or have quit within the past 15 years. Yearly screening is stopped once you have quit smoking for at least 15 years or develop a health problem that would prevent you from having lung cancer treatment.  Clinical breast exam.** / Every year after age 35 years.  BRCA-related cancer risk assessment.** / For women who have family members with a BRCA-related cancer (breast, ovarian, tubal, or peritoneal cancers).  Mammogram.** / Every year beginning at age 109 years and continuing for as long as you are in good health. Consult with your health care provider.  Pap test.** / Every 3 years starting at age 44 years through age 94 or 70 years with a history of 3 consecutive normal Pap tests.  HPV screening.** / Every 3 years from ages 109 years through ages 50 to 30 years with a history of 3 consecutive normal Pap tests.  Fecal occult blood test (FOBT) of stool. / Every year beginning at age 73 years and continuing until age 59 years. You may not need to do this test if you get a colonoscopy every 10 years.  Flexible sigmoidoscopy or colonoscopy.** / Every 5 years for a flexible sigmoidoscopy or every 10 years for a colonoscopy beginning at age 68 years and continuing until age 12 years.  Hepatitis C blood test.** / For all people born from [birth year] through  1965 and any individual with known risks for hepatitis C.  Skin self-exam. / Monthly.  Influenza vaccine. / Every year.  Tetanus, diphtheria, and acellular pertussis (Tdap/Td) vaccine.** / Consult your health care provider. Pregnant women should receive 1 dose of Tdap vaccine during each pregnancy. 1 dose of Td every 10 years.  Varicella vaccine.** / Consult your health care provider. Pregnant females who do not have evidence of immunity should receive the first dose after pregnancy.  Zoster vaccine.** / 1 dose for adults aged 2 years or older.  Measles, mumps, rubella (MMR) vaccine.** / You need at least 1 dose of MMR if you were born in 1957 or later. You may also need a 2nd dose. For females of childbearing age, rubella immunity should be determined. If there is no evidence of immunity, females who are not pregnant should be vaccinated. If there is no evidence of immunity, females who are pregnant should delay immunization until after pregnancy.  Pneumococcal 13-valent conjugate (PCV13) vaccine.** / Consult your health care provider.  Pneumococcal polysaccharide (PPSV23) vaccine.** / 1 to 2 doses if you smoke cigarettes or if you have certain conditions.  Meningococcal vaccine.** / Consult your health care provider.  Hepatitis A vaccine.** / Consult your health care provider.  Hepatitis B vaccine.** / Consult your health care provider.  Haemophilus influenzae type b (Hib) vaccine.** / Consult your health care provider. Ages 48 years  and over  Blood pressure check.** / Every 1 to 2 years.  Lipid and cholesterol check.** / Every 5 years beginning at age 4 years.  Lung cancer screening. / Every year if you are aged 43-80 years and have a 30-pack-year history of smoking and currently smoke or have quit within the past 15 years. Yearly screening is stopped once you have quit smoking for at least 15 years or develop a health problem that would prevent you from having lung cancer  treatment.  Clinical breast exam.** / Every year after age 27 years.  BRCA-related cancer risk assessment.** / For women who have family members with a BRCA-related cancer (breast, ovarian, tubal, or peritoneal cancers).  Mammogram.** / Every year beginning at age 102 years and continuing for as long as you are in good health. Consult with your health care provider.  Pap test.** / Every 3 years starting at age 51 years through age 36 or 3 years with 3 consecutive normal Pap tests. Testing can be stopped between 65 and 70 years with 3 consecutive normal Pap tests and no abnormal Pap or HPV tests in the past 10 years.  HPV screening.** / Every 3 years from ages 82 years through ages 25 or 23 years with a history of 3 consecutive normal Pap tests. Testing can be stopped between 65 and 70 years with 3 consecutive normal Pap tests and no abnormal Pap or HPV tests in the past 10 years.  Fecal occult blood test (FOBT) of stool. / Every year beginning at age 6 years and continuing until age 15 years. You may not need to do this test if you get a colonoscopy every 10 years.  Flexible sigmoidoscopy or colonoscopy.** / Every 5 years for a flexible sigmoidoscopy or every 10 years for a colonoscopy beginning at age 53 years and continuing until age 40 years.  Hepatitis C blood test.** / For all people born from [birth year] through 1963 and any individual with known risks for hepatitis C.  Osteoporosis screening.** / A one-time screening for women ages 67 years and over and women at risk for fractures or osteoporosis.  Skin self-exam. / Monthly.  Influenza vaccine. / Every year.  Tetanus, diphtheria, and acellular pertussis (Tdap/Td) vaccine.** / 1 dose of Td every 10 years.  Varicella vaccine.** / Consult your health care provider.  Zoster vaccine.** / 1 dose for adults aged 74 years or older.  Pneumococcal 13-valent conjugate (PCV13) vaccine.** / Consult your health care provider.  Pneumococcal  polysaccharide (PPSV23) vaccine.** / 1 dose for all adults aged 28 years and older.  Meningococcal vaccine.** / Consult your health care provider.  Hepatitis A vaccine.** / Consult your health care provider.  Hepatitis B vaccine.** / Consult your health care provider.  Haemophilus influenzae type b (Hib) vaccine.** / Consult your health care provider. ** Family history and personal history of risk and conditions may change your health care provider's recommendations. Hepatitis C Hepatitis C is a viral infection of the liver. Infection may go undetected for months or years because symptoms may be absent or very mild. Chronic liver disease is the main danger of hepatitis C. This may lead to scarring of the liver (cirrhosis), liver failure, and liver cancer. CAUSES  Hepatitis C is caused by the hepatitis C virus (HCV). Formerly, hepatitis C infections were most commonly transmitted through blood transfusions. In the early 1990s, routine testing of donated blood for hepatitis C and exclusion of blood that tests positive for HCV began. Now, HCV is  most commonly transmitted from person to person through injection drug use, sharing needles, or sex with an infected person. A caregiver may also get the infection from exposure to the blood of an infected patient by way of a cut or needle stick.  SYMPTOMS  Acute Phase Many cases of acute HCV infection are mild and cause few problems.Some people may not even realize they are sick.Symptoms in others may last a few weeks to several months and include:  Feeling very tired.  Loss of appetite.  Nausea.  Vomiting.  Abdominal pain.  Dark yellow urine.  Yellow skin and eyes (jaundice).  Itching of the skin. Chronic Phase  Between 50% to 85% of people who get HCV infection become "chronic carriers." They often have no symptoms, but the virus stays in their body.They may spread the virus to others and can get long-term liver disease.  Many people  with chronic HCV infection remain healthy for many years. However, up to 1 in 5 chronically infected people may develop severe liver diseases including scarring of the liver (cirrhosis), liver failure, or liver cancer. DIAGNOSIS  Diagnosis of hepatitis C infection is made by testing blood for the presence of hepatitis C viral particles called RNA. Other tests may also be done to measure the status of current liver function, exclude other liver problems, or assess liver damage. TREATMENT  Treatment with many antiviral drugs is available and recommended for some patients with chronic HCV infection. Drug treatment is generally considered appropriate for patients who:  Are 47 years of age or older.  Have a positive test for HCV particles in the blood.  Have a liver tissue sample (biopsy) that shows chronic hepatitis and significant scarring (fibrosis).  Do not have signs of liver failure.  Have acceptable blood test results that confirm the wellness of other body organs.  Are willing to be treated and conform to treatment requirements.  Have no other circumstances that would prevent treatment from being recommended (contraindications). All people who are offered and choose to receive drug treatment must understand that careful medical follow up for many months and even years is crucial in order to make successful care possible. The goal of drug treatment is to eliminate any evidence of HCV in the blood on a long-term basis. This is called a "sustained virologic response" or SVR. Achieving a SVR is associated with a decrease in the chance of life-threatening liver problems, need for a liver transplant, liver cancer rates, and liver-related complications. Successful treatment currently requires taking treatment drugs for at least 24 weeks and up to 72 weeks. An injected drug (interferon) given weekly and an oral antiviral medicine taken daily are usually prescribed. Side effects from these drugs are  common and some may be very serious. Your response to treatment must be carefully monitored by both you and your caregiver throughout the entire treatment period. PREVENTION There is no vaccine for hepatitis C. The only way to prevent the disease is to reduce the risk of exposure to the virus.   Avoid sharing drug needles or personal items like toothbrushes, razors, and nail clippers with an infected person.  Healthcare workers need to avoid injuries and wear appropriate protective equipment such as gloves, gowns, and face masks when performing invasive medical or nursing procedures. HOME CARE INSTRUCTIONS  To avoid making your liver disease worse:  Strictly avoid drinking alcohol.  Carefully review all new prescriptions of medicines with your caregiver. Ask your caregiver which drugs you should avoid. The following drugs  are toxic to the liver, and your caregiver may tell you to avoid them:  Isoniazid.  Methyldopa.  Acetaminophen.  Anabolic steroids (muscle-building drugs).  Erythromycin.  Oral contraceptives (birth control pills).  Check with your caregiver to make sure medicine you are currently taking will not be harmful.  Periodic blood tests may be required. Follow your caregiver's advice about when you should have blood tests.  Avoid a sexual relationship until advised otherwise by your caregiver.  Avoid activities that could expose other people to your blood. Examples include sharing a toothbrush, nail clippers, razors, and needles.  Bed rest is not necessary, but it may make you feel better. Recovery time is not related to the amount of rest you receive.  This infection is contagious. Follow your caregiver's instructions in order to avoid spread of the infection. SEEK IMMEDIATE MEDICAL CARE IF:  You have increasing fatigue or weakness.  You have an oral temperature above 102 F (38.9 C), not controlled by medicine.  You develop loss of appetite, nausea, or  vomiting.  You develop jaundice.  You develop easy bruising or bleeding.  You develop any severe problems as a result of your treatment. MAKE SURE YOU:   Understand these instructions.  Will watch your condition.  Will get help right away if you are not doing well or get worse. Document Released: 09/12/2000 Document Revised: 12/08/2011 Document Reviewed: 01/15/2011 [facility] Patient Information 2015 [place], [place]. This information is not intended to replace advice given to you by your health care provider. Make sure you discuss any questions you have with your health care provider. Document Released: 11/11/2001 Document Revised: 09/20/2013 Document Reviewed: 02/10/2011 [employer] Patient Information 2015 [place], [place]. This information is not intended to replace advice given to you by your health care provider. Make sure you discuss any questions you have with your health care provider.

## 2014-04-14 ENCOUNTER — Ambulatory Visit
Admission: RE | Admit: 2014-04-14 | Discharge: 2014-04-14 | Disposition: A | Discharge status: Home / Self Care | Payer: BC Managed Care – PPO | Source: Ambulatory Visit | Attending: Nurse Practitioner | Admitting: Nurse Practitioner

## 2014-04-14 DIAGNOSIS — C220 Liver cell carcinoma: Secondary | ICD-10-CM

## 2014-04-22 ENCOUNTER — Other Ambulatory Visit: Payer: Self-pay | Admitting: Family Medicine

## 2014-06-29 ENCOUNTER — Encounter: Payer: Self-pay | Admitting: Nurse Practitioner

## 2014-06-30 ENCOUNTER — Other Ambulatory Visit: Payer: Self-pay | Admitting: *Deleted

## 2014-07-01 MED ORDER — ATORVASTATIN CALCIUM 80 MG PO TABS: 80.0000 mg | ORAL_TABLET | Freq: Every day | ORAL | Status: DC

## 2014-07-11 ENCOUNTER — Other Ambulatory Visit: Payer: Self-pay | Admitting: *Deleted

## 2014-07-11 DIAGNOSIS — M1990 Unspecified osteoarthritis, unspecified site: Secondary | ICD-10-CM

## 2014-07-11 MED ORDER — TRAMADOL HCL 50 MG PO TABS: 50.0000 mg | ORAL_TABLET | Freq: Four times a day (QID) | ORAL | Status: DC | PRN

## 2014-07-12 NOTE — Telephone Encounter (Signed)
LM for pharmacy with rx details. Jazmin Hartsell,CMA

## 2014-09-05 ENCOUNTER — Other Ambulatory Visit: Payer: Self-pay | Admitting: *Deleted

## 2014-09-05 MED ORDER — ATORVASTATIN CALCIUM 80 MG PO TABS
80.0000 mg | ORAL_TABLET | Freq: Every day | ORAL | Status: DC
Start: 2014-09-05 — End: 2014-11-04

## 2014-11-04 ENCOUNTER — Other Ambulatory Visit: Payer: Self-pay | Admitting: Family Medicine

## 2014-12-08 ENCOUNTER — Other Ambulatory Visit: Payer: Self-pay | Admitting: *Deleted

## 2014-12-08 DIAGNOSIS — M1990 Unspecified osteoarthritis, unspecified site: Secondary | ICD-10-CM

## 2014-12-08 MED ORDER — TRAMADOL HCL 50 MG PO TABS: 50.0000 mg | ORAL_TABLET | Freq: Four times a day (QID) | ORAL | Status: DC | PRN

## 2014-12-11 NOTE — Telephone Encounter (Signed)
Called Tramadol to [facility] in [facility] and gave verbal order as directed as below.  Nekita Pita, CMA.

## 2015-01-02 ENCOUNTER — Other Ambulatory Visit: Payer: Self-pay | Admitting: Family Medicine

## 2015-03-05 ENCOUNTER — Other Ambulatory Visit: Payer: Self-pay | Admitting: Family Medicine

## 2015-04-05 ENCOUNTER — Other Ambulatory Visit: Payer: Self-pay

## 2015-04-05 DIAGNOSIS — Z1231 Encounter for screening mammogram for malignant neoplasm of breast: Secondary | ICD-10-CM

## 2015-04-23 ENCOUNTER — Ambulatory Visit
Admission: RE | Admit: 2015-04-23 | Discharge: 2015-04-23 | Disposition: A | Discharge status: Home / Self Care | Payer: BLUE CROSS/BLUE SHIELD | Source: Ambulatory Visit

## 2015-04-23 DIAGNOSIS — Z1231 Encounter for screening mammogram for malignant neoplasm of breast: Secondary | ICD-10-CM

## 2015-04-24 ENCOUNTER — Ambulatory Visit (INDEPENDENT_AMBULATORY_CARE_PROVIDER_SITE_OTHER): Payer: BLUE CROSS/BLUE SHIELD | Admitting: Family Medicine

## 2015-04-24 ENCOUNTER — Ambulatory Visit: Payer: Self-pay

## 2015-04-24 ENCOUNTER — Encounter: Payer: Self-pay | Admitting: Family Medicine

## 2015-04-24 VITALS — Temp 98.8°F | Ht 68.0 in | Wt 146.0 lb

## 2015-04-24 DIAGNOSIS — I2510 Atherosclerotic heart disease of native coronary artery without angina pectoris: Secondary | ICD-10-CM

## 2015-04-24 DIAGNOSIS — F17200 Nicotine dependence, unspecified, uncomplicated: Secondary | ICD-10-CM

## 2015-04-24 DIAGNOSIS — Z720 Tobacco use: Secondary | ICD-10-CM

## 2015-04-24 DIAGNOSIS — B182 Chronic viral hepatitis C: Secondary | ICD-10-CM

## 2015-04-24 DIAGNOSIS — M1990 Unspecified osteoarthritis, unspecified site: Secondary | ICD-10-CM | POA: Diagnosis not present

## 2015-04-24 DIAGNOSIS — R21 Rash and other nonspecific skin eruption: Secondary | ICD-10-CM | POA: Diagnosis not present

## 2015-04-24 DIAGNOSIS — K732 Chronic active hepatitis, not elsewhere classified: Secondary | ICD-10-CM

## 2015-04-24 DIAGNOSIS — Z0000 Encounter for general adult medical examination without abnormal findings: Secondary | ICD-10-CM

## 2015-04-24 LAB — COMPREHENSIVE METABOLIC PANEL
ALK PHOS: 32 U/L — AB (ref 33–130)
ALT: 41 U/L — ABNORMAL HIGH (ref 6–29)
AST: 33 U/L (ref 10–35)
Albumin: 4.2 g/dL (ref 3.6–5.1)
BILIRUBIN TOTAL: 0.5 mg/dL (ref 0.2–1.2)
BUN: 15 mg/dL (ref 7–25)
CO2: 29 meq/L (ref 20–31)
CREATININE: 0.92 mg/dL (ref 0.50–1.05)
Calcium: 9.5 mg/dL (ref 8.6–10.4)
Chloride: 106 mEq/L (ref 98–110)
GLUCOSE: 86 mg/dL (ref 65–99)
Potassium: 4.5 mEq/L (ref 3.5–5.3)
SODIUM: 142 meq/L (ref 135–146)
Total Protein: 6.8 g/dL (ref 6.1–8.1)

## 2015-04-24 LAB — LIPID PANEL
Cholesterol: 141 mg/dL (ref 125–200)
HDL: 75 mg/dL (ref >=46)
LDL CALC: 53 mg/dL (ref <130)
Total CHOL/HDL Ratio: 1.9 Ratio (ref <=5.0)
Triglycerides: 66 mg/dL (ref <150)
VLDL: 13 mg/dL (ref <30)

## 2015-04-24 MED ORDER — TRAMADOL HCL 50 MG PO TABS: 50.0000 mg | ORAL_TABLET | Freq: Four times a day (QID) | ORAL | Status: DC | PRN

## 2015-04-24 MED ORDER — AMMONIUM LACTATE 12 % EX CREA: TOPICAL_CREAM | CUTANEOUS | Status: DC | PRN

## 2015-04-24 MED ORDER — ATORVASTATIN CALCIUM 80 MG PO TABS: 40.0000 mg | ORAL_TABLET | Freq: Every day | ORAL | Status: DC

## 2015-04-24 MED ORDER — NITROGLYCERIN 0.4 MG SL SUBL: 0.4000 mg | SUBLINGUAL_TABLET | SUBLINGUAL | Status: DC | PRN

## 2015-04-24 NOTE — Progress Notes (Signed)
Subjective:    Patient ID: Taylor Walker is a 59 y.o. female presenting with Annual Exam and Rash  on 04/24/2015  HPI: Here today for annual exam. She reports doing well.  She is having some new onset rash around her knee and elbow.  She has been seen at Hep C clinic and has no fibrosis.  Her insurance has denied Harvoni.  She is due to f/u with them in 6 months. She needs med refill and blood work. Mammogram done yesterday and WNL.  Review of Systems  Constitutional: Negative for fever and chills.  HENT: Negative for congestion, nosebleeds and rhinorrhea.   Eyes: Negative for visual disturbance.  Respiratory: Negative for chest tightness and shortness of breath.   Cardiovascular: Negative for chest pain.  Gastrointestinal: Negative for nausea, vomiting, abdominal pain, diarrhea, constipation, blood in stool and abdominal distention.  Genitourinary: Negative for dysuria, frequency, vaginal bleeding and menstrual problem.  Musculoskeletal: Negative for arthralgias.  Skin: Negative for rash.  Neurological: Negative for dizziness and headaches.  Psychiatric/Behavioral: Negative for behavioral problems, sleep disturbance and dysphoric mood.  All other systems reviewed and are negative.     Objective:    Temp(Src) 98.8 F (37.1 C) (Oral)  Ht 5\' 8"  (1.727 m)  Wt 146 lb (66.225 kg)  BMI 22.20 kg/m2 Physical Exam  Constitutional: She is oriented to person, place, and time. She appears well-developed and well-nourished.  HENT:  Head: Normocephalic and atraumatic.  Right Ear: External ear normal.  Left Ear: External ear normal.  Mouth/Throat: Oropharynx is clear and moist.  Eyes: Pupils are equal, round, and reactive to light. No scleral icterus.  Neck: Normal range of motion. No thyromegaly present.  Cardiovascular: Normal rate, regular rhythm and intact distal pulses.   No murmur heard. Pulmonary/Chest: Effort normal and breath sounds normal.  Abdominal: Soft. She exhibits  no distension. There is no tenderness.  Musculoskeletal: She exhibits no edema.  Lymphadenopathy:    She has no cervical adenopathy.  Neurological: She is alert and oriented to person, place, and time. She exhibits normal muscle tone.  Skin: Skin is warm and dry.  Psychiatric: She has a normal mood and affect. Her behavior is normal.        Assessment & Plan:   Problem List Items Addressed This Visit      Unprioritized   Chronic active hepatitis C-genotype 1a    Refer back to hep clinic--unable to get treatment at this time.      Relevant Orders   AMB referral to hepatitis C clinic   TOBACCO USE DISORDER/SMOKER-SMOKING CESSATION DISCUSSED    Again discussed smoking cessation.  She has a 7 pack year only.      Coronary atherosclerosis - Primary    Related to drug use and none recently--doing well. Continue Lipitor and baby ASA.      Relevant Medications   atorvastatin (LIPITOR) 80 MG tablet   nitroGLYCERIN (NITROSTAT) 0.4 MG SL tablet   Other Relevant Orders   Comprehensive metabolic panel   Lipid panel   Arthritis   Relevant Medications   traMADol (ULTRAM) 50 MG tablet    Other Visit Diagnoses    Routine general medical examination at a health care facility        Relevant Orders    CBC    TSH    Rash and nonspecific skin eruption        Relevant Medications    ammonium lactate (LAC-HYDRIN) 12 % cream  Return in about 1 year (around 04/23/2016).  Lesleyann Fichter S 04/24/2015 10:50 AM

## 2015-04-24 NOTE — Assessment & Plan Note (Signed)
Again discussed smoking cessation.  She has a 7 pack year only.

## 2015-04-24 NOTE — Assessment & Plan Note (Signed)
Refer back to hep clinic--unable to get treatment at this time.

## 2015-04-24 NOTE — Assessment & Plan Note (Addendum)
Related to drug use and none recently--doing well. Continue Lipitor and baby ASA.

## 2015-04-24 NOTE — Patient Instructions (Signed)
Preventive Care for Adults A healthy lifestyle and preventive care can promote health and wellness. Preventive health guidelines for women include the following key practices.  A routine yearly physical is a good way to check with your health care provider about your health and preventive screening. It is a chance to share any concerns and updates on your health and to receive a thorough exam.  Visit your dentist for a routine exam and preventive care every 6 months. Brush your teeth twice a day and floss once a day. Good oral hygiene prevents tooth decay and gum disease.  The frequency of eye exams is based on your age, health, family medical history, use of contact lenses, and other factors. Follow your health care provider's recommendations for frequency of eye exams.  Eat a healthy diet. Foods like vegetables, fruits, whole grains, low-fat dairy products, and lean protein foods contain the nutrients you need without too many calories. Decrease your intake of foods high in solid fats, added sugars, and salt. Eat the right amount of calories for you.Get information about a proper diet from your health care provider, if necessary.  Regular physical exercise is one of the most important things you can do for your health. Most adults should get at least 150 minutes of moderate-intensity exercise (any activity that increases your heart rate and causes you to sweat) each week. In addition, most adults need muscle-strengthening exercises on 2 or more days a week.  Maintain a healthy weight. The body mass index (BMI) is a screening tool to identify possible weight problems. It provides an estimate of body fat based on height and weight. Your health care provider can find your BMI and can help you achieve or maintain a healthy weight.For adults 20 years and older:  A BMI below 18.5 is considered underweight.  A BMI of 18.5 to 24.9 is normal.  A BMI of 25 to 29.9 is considered overweight.  A BMI of  30 and above is considered obese.  Maintain normal blood lipids and cholesterol levels by exercising and minimizing your intake of saturated fat. Eat a balanced diet with plenty of fruit and vegetables. Blood tests for lipids and cholesterol should begin at age 87 and be repeated every 5 years. If your lipid or cholesterol levels are high, you are over 50, or you are at high risk for heart disease, you may need your cholesterol levels checked more frequently.Ongoing high lipid and cholesterol levels should be treated with medicines if diet and exercise are not working.  If you smoke, find out from your health care provider how to quit. If you do not use tobacco, do not start.  Lung cancer screening is recommended for adults aged 38-80 years who are at high risk for developing lung cancer because of a history of smoking. A yearly low-dose CT scan of the lungs is recommended for people who have at least a 30-pack-year history of smoking and are a current smoker or have quit within the past 15 years. A pack year of smoking is smoking an average of 1 pack of cigarettes a day for 1 year (for example: 1 pack a day for 30 years or 2 packs a day for 15 years). Yearly screening should continue until the smoker has stopped smoking for at least 15 years. Yearly screening should be stopped for people who develop a health problem that would prevent them from having lung cancer treatment.  If you are pregnant, do not drink alcohol. If you are breastfeeding,  be very cautious about drinking alcohol. If you are not pregnant and choose to drink alcohol, do not have more than 1 drink per day. One drink is considered to be 12 ounces (355 mL) of beer, 5 ounces (148 mL) of wine, or 1.5 ounces (44 mL) of liquor.  Avoid use of street drugs. Do not share needles with anyone. Ask for help if you need support or instructions about stopping the use of drugs.  High blood pressure causes heart disease and increases the risk of  stroke. Your blood pressure should be checked at least every 1 to 2 years. Ongoing high blood pressure should be treated with medicines if weight loss and exercise do not work.  If you are 75-52 years old, ask your health care provider if you should take aspirin to prevent strokes.  Diabetes screening involves taking a blood sample to check your fasting blood sugar level. This should be done once every 3 years, after age 15, if you are within normal weight and without risk factors for diabetes. Testing should be considered at a younger age or be carried out more frequently if you are overweight and have at least 1 risk factor for diabetes.  Breast cancer screening is essential preventive care for women. You should practice "breast self-awareness." This means understanding the normal appearance and feel of your breasts and may include breast self-examination. Any changes detected, no matter how small, should be reported to a health care provider. Women in their 58s and 30s should have a clinical breast exam (CBE) by a health care provider as part of a regular health exam every 1 to 3 years. After age 16, women should have a CBE every year. Starting at age 53, women should consider having a mammogram (breast X-ray test) every year. Women who have a family history of breast cancer should talk to their health care provider about genetic screening. Women at a high risk of breast cancer should talk to their health care providers about having an MRI and a mammogram every year.  Breast cancer gene (BRCA)-related cancer risk assessment is recommended for women who have family members with BRCA-related cancers. BRCA-related cancers include breast, ovarian, tubal, and peritoneal cancers. Having family members with these cancers may be associated with an increased risk for harmful changes (mutations) in the breast cancer genes BRCA1 and BRCA2. Results of the assessment will determine the need for genetic counseling and  BRCA1 and BRCA2 testing.  Routine pelvic exams to screen for cancer are no longer recommended for nonpregnant women who are considered low risk for cancer of the pelvic organs (ovaries, uterus, and vagina) and who do not have symptoms. Ask your health care provider if a screening pelvic exam is right for you.  If you have had past treatment for cervical cancer or a condition that could lead to cancer, you need Pap tests and screening for cancer for at least 20 years after your treatment. If Pap tests have been discontinued, your risk factors (such as having a new sexual partner) need to be reassessed to determine if screening should be resumed. Some women have medical problems that increase the chance of getting cervical cancer. In these cases, your health care provider may recommend more frequent screening and Pap tests.  The HPV test is an additional test that may be used for cervical cancer screening. The HPV test looks for the virus that can cause the cell changes on the cervix. The cells collected during the Pap test can be  tested for HPV. The HPV test could be used to screen women aged 62 years and older, and should be used in women of any age who have unclear Pap test results. After the age of 18, women should have HPV testing at the same frequency as a Pap test.  Colorectal cancer can be detected and often prevented. Most routine colorectal cancer screening begins at the age of 55 years and continues through age 40 years. However, your health care provider may recommend screening at an earlier age if you have risk factors for colon cancer. On a yearly basis, your health care provider may provide home test kits to check for hidden blood in the stool. Use of a small camera at the end of a tube, to directly examine the colon (sigmoidoscopy or colonoscopy), can detect the earliest forms of colorectal cancer. Talk to your health care provider about this at age 14, when routine screening begins. Direct  exam of the colon should be repeated every 5-10 years through age 98 years, unless early forms of pre-cancerous polyps or small growths are found.  People who are at an increased risk for hepatitis B should be screened for this virus. You are considered at high risk for hepatitis B if:  You were born in a country where hepatitis B occurs often. Talk with your health care provider about which countries are considered high risk.  Your parents were born in a high-risk country and you have not received a shot to protect against hepatitis B (hepatitis B vaccine).  You have HIV or AIDS.  You use needles to inject street drugs.  You live with, or have sex with, someone who has hepatitis B.  You get hemodialysis treatment.  You take certain medicines for conditions like cancer, organ transplantation, and autoimmune conditions.  Hepatitis C blood testing is recommended for all people born from [birth year] through 1963 and any individual with known risks for hepatitis C.  Practice safe sex. Use condoms and avoid high-risk sexual practices to reduce the spread of sexually transmitted infections (STIs). STIs include gonorrhea, chlamydia, syphilis, trichomonas, herpes, HPV, and human immunodeficiency virus (HIV). Herpes, HIV, and HPV are viral illnesses that have no cure. They can result in disability, cancer, and death.  You should be screened for sexually transmitted illnesses (STIs) including gonorrhea and chlamydia if:  You are sexually active and are younger than 24 years.  You are older than 24 years and your health care provider tells you that you are at risk for this type of infection.  Your sexual activity has changed since you were last screened and you are at an increased risk for chlamydia or gonorrhea. Ask your health care provider if you are at risk.  If you are at risk of being infected with HIV, it is recommended that you take a prescription medicine daily to prevent HIV infection. This is  called preexposure prophylaxis (PrEP). You are considered at risk if:  You are a heterosexual woman, are sexually active, and are at increased risk for HIV infection.  You take drugs by injection.  You are sexually active with a partner who has HIV.  Talk with your health care provider about whether you are at high risk of being infected with HIV. If you choose to begin PrEP, you should first be tested for HIV. You should then be tested every 3 months for as long as you are taking PrEP.  Osteoporosis is a disease in which the bones lose minerals and strength  with aging. This can result in serious bone fractures or breaks. The risk of osteoporosis can be identified using a bone density scan. Women ages 65 years and over and women at risk for fractures or osteoporosis should discuss screening with their health care providers. Ask your health care provider whether you should take a calcium supplement or vitamin D to reduce the rate of osteoporosis.  Menopause can be associated with physical symptoms and risks. Hormone replacement therapy is available to decrease symptoms and risks. You should talk to your health care provider about whether hormone replacement therapy is right for you.  Use sunscreen. Apply sunscreen liberally and repeatedly throughout the day. You should seek shade when your shadow is shorter than you. Protect yourself by wearing long sleeves, pants, a wide-brimmed hat, and sunglasses year round, whenever you are outdoors.  Once a month, do a whole body skin exam, using a mirror to look at the skin on your back. Tell your health care provider of new moles, moles that have irregular borders, moles that are larger than a pencil eraser, or moles that have changed in shape or color.  Stay current with required vaccines (immunizations).  Influenza vaccine. All adults should be immunized every year.  Tetanus, diphtheria, and acellular pertussis (Td, Tdap) vaccine. Pregnant women should  receive 1 dose of Tdap vaccine during each pregnancy. The dose should be obtained regardless of the length of time since the last dose. Immunization is preferred during the 27th-36th week of gestation. An adult who has not previously received Tdap or who does not know her vaccine status should receive 1 dose of Tdap. This initial dose should be followed by tetanus and diphtheria toxoids (Td) booster doses every 10 years. Adults with an unknown or incomplete history of completing a 3-dose immunization series with Td-containing vaccines should begin or complete a primary immunization series including a Tdap dose. Adults should receive a Td booster every 10 years.  Varicella vaccine. An adult without evidence of immunity to varicella should receive 2 doses or a second dose if she has previously received 1 dose. Pregnant females who do not have evidence of immunity should receive the first dose after pregnancy. This first dose should be obtained before leaving the health care facility. The second dose should be obtained 4-8 weeks after the first dose.  Human papillomavirus (HPV) vaccine. Females aged 13-26 years who have not received the vaccine previously should obtain the 3-dose series. The vaccine is not recommended for use in pregnant females. However, pregnancy testing is not needed before receiving a dose. If a female is found to be pregnant after receiving a dose, no treatment is needed. In that case, the remaining doses should be delayed until after the pregnancy. Immunization is recommended for any person with an immunocompromised condition through the age of 26 years if she did not get any or all doses earlier. During the 3-dose series, the second dose should be obtained 4-8 weeks after the first dose. The third dose should be obtained 24 weeks after the first dose and 16 weeks after the second dose.  Zoster vaccine. One dose is recommended for adults aged 60 years or older unless certain conditions are  present.  Measles, mumps, and rubella (MMR) vaccine. Adults born before 1957 generally are considered immune to measles and mumps. Adults born in 1957 or later should have 1 or more doses of MMR vaccine unless there is a contraindication to the vaccine or there is laboratory evidence of immunity to   each of the three diseases. A routine second dose of MMR vaccine should be obtained at least 28 days after the first dose for students attending postsecondary schools, health care workers, or international travelers. People who received inactivated measles vaccine or an unknown type of measles vaccine during 1963-1967 should receive 2 doses of MMR vaccine. People who received inactivated mumps vaccine or an unknown type of mumps vaccine before 1979 and are at high risk for mumps infection should consider immunization with 2 doses of MMR vaccine. For females of childbearing age, rubella immunity should be determined. If there is no evidence of immunity, females who are not pregnant should be vaccinated. If there is no evidence of immunity, females who are pregnant should delay immunization until after pregnancy. Unvaccinated health care workers born before [birth year] who lack laboratory evidence of measles, mumps, or rubella immunity or laboratory confirmation of disease should consider measles and mumps immunization with 2 doses of MMR vaccine or rubella immunization with 1 dose of MMR vaccine.  Pneumococcal 13-valent conjugate (PCV13) vaccine. When indicated, a person who is uncertain of her immunization history and has no record of immunization should receive the PCV13 vaccine. An adult aged 13 years or older who has certain medical conditions and has not been previously immunized should receive 1 dose of PCV13 vaccine. This PCV13 should be followed with a dose of pneumococcal polysaccharide (PPSV23) vaccine. The PPSV23 vaccine dose should be obtained at least 8 weeks after the dose of PCV13 vaccine. An adult aged 70  years or older who has certain medical conditions and previously received 1 or more doses of PPSV23 vaccine should receive 1 dose of PCV13. The PCV13 vaccine dose should be obtained 1 or more years after the last PPSV23 vaccine dose.  Pneumococcal polysaccharide (PPSV23) vaccine. When PCV13 is also indicated, PCV13 should be obtained first. All adults aged 54 years and older should be immunized. An adult younger than age 59 years who has certain medical conditions should be immunized. Any person who resides in a nursing home or long-term care facility should be immunized. An adult smoker should be immunized. People with an immunocompromised condition and certain other conditions should receive both PCV13 and PPSV23 vaccines. People with human immunodeficiency virus (HIV) infection should be immunized as soon as possible after diagnosis. Immunization during chemotherapy or radiation therapy should be avoided. Routine use of PPSV23 vaccine is not recommended for American Indians, [place] Natives, or people younger than 65 years unless there are medical conditions that require PPSV23 vaccine. When indicated, people who have unknown immunization and have no record of immunization should receive PPSV23 vaccine. One-time revaccination 5 years after the first dose of PPSV23 is recommended for people aged 19-64 years who have chronic kidney failure, nephrotic syndrome, asplenia, or immunocompromised conditions. People who received 1-2 doses of PPSV23 before age 35 years should receive another dose of PPSV23 vaccine at age 63 years or later if at least 5 years have passed since the previous dose. Doses of PPSV23 are not needed for people immunized with PPSV23 at or after age 21 years.  Meningococcal vaccine. Adults with asplenia or persistent complement component deficiencies should receive 2 doses of quadrivalent meningococcal conjugate (MenACWY-D) vaccine. The doses should be obtained at least 2 months apart.  Microbiologists working with certain meningococcal bacteria, [employer] recruits, people at risk during an outbreak, and people who travel to or live in countries with a high rate of meningitis should be immunized. A first-year college student up through age  21 years who is living in a residence hall should receive a dose if she did not receive a dose on or after her 16th birthday. Adults who have certain high-risk conditions should receive one or more doses of vaccine.  Hepatitis A vaccine. Adults who wish to be protected from this disease, have certain high-risk conditions, work with hepatitis A-infected animals, work in hepatitis A research labs, or travel to or work in countries with a high rate of hepatitis A should be immunized. Adults who were previously unvaccinated and who anticipate close contact with an international adoptee during the first 60 days after arrival in the [place] from a country with a high rate of hepatitis A should be immunized.  Hepatitis B vaccine. Adults who wish to be protected from this disease, have certain high-risk conditions, may be exposed to blood or other infectious body fluids, are household contacts or sex partners of hepatitis B positive people, are clients or workers in certain care facilities, or travel to or work in countries with a high rate of hepatitis B should be immunized.  Haemophilus influenzae type b (Hib) vaccine. A previously unvaccinated person with asplenia or sickle cell disease or having a scheduled splenectomy should receive 1 dose of Hib vaccine. Regardless of previous immunization, a recipient of a hematopoietic stem cell transplant should receive a 3-dose series 6-12 months after her successful transplant. Hib vaccine is not recommended for adults with HIV infection. Preventive Services / Frequency Ages 34 to 72 years  Blood pressure check.** / Every 1 to 2 years.  Lipid and cholesterol check.** / Every 5 years beginning at age  69.  Clinical breast exam.** / Every 3 years for women in their 55s and 72s.  BRCA-related cancer risk assessment.** / For women who have family members with a BRCA-related cancer (breast, ovarian, tubal, or peritoneal cancers).  Pap test.** / Every 2 years from ages 46 through 1. Every 3 years starting at age 40 through age 75 or 39 with a history of 3 consecutive normal Pap tests.  HPV screening.** / Every 3 years from ages 2 through ages 18 to 11 with a history of 3 consecutive normal Pap tests.  Hepatitis C blood test.** / For any individual with known risks for hepatitis C.  Skin self-exam. / Monthly.  Influenza vaccine. / Every year.  Tetanus, diphtheria, and acellular pertussis (Tdap, Td) vaccine.** / Consult your health care provider. Pregnant women should receive 1 dose of Tdap vaccine during each pregnancy. 1 dose of Td every 10 years.  Varicella vaccine.** / Consult your health care provider. Pregnant females who do not have evidence of immunity should receive the first dose after pregnancy.  HPV vaccine. / 3 doses over 6 months, if 45 and younger. The vaccine is not recommended for use in pregnant females. However, pregnancy testing is not needed before receiving a dose.  Measles, mumps, rubella (MMR) vaccine.** / You need at least 1 dose of MMR if you were born in 1957 or later. You may also need a 2nd dose. For females of childbearing age, rubella immunity should be determined. If there is no evidence of immunity, females who are not pregnant should be vaccinated. If there is no evidence of immunity, females who are pregnant should delay immunization until after pregnancy.  Pneumococcal 13-valent conjugate (PCV13) vaccine.** / Consult your health care provider.  Pneumococcal polysaccharide (PPSV23) vaccine.** / 1 to 2 doses if you smoke cigarettes or if you have certain conditions.  Meningococcal vaccine.** /  1 dose if you are age 66 to 52 years and a [place] living in a residence hall, or have one of several medical conditions, you need to get vaccinated against meningococcal disease. You may also need additional booster doses.  Hepatitis A vaccine.** / Consult your health care provider.  Hepatitis B vaccine.** / Consult your health care provider.  Haemophilus influenzae type b (Hib) vaccine.** / Consult your health care provider. Ages 63 to 63 years  Blood pressure check.** / Every 1 to 2 years.  Lipid and cholesterol check.** / Every 5 years beginning at age 70 years.  Lung cancer screening. / Every year if you are aged 1-80 years and have a 30-pack-year history of smoking and currently smoke or have quit within the past 15 years. Yearly screening is stopped once you have quit smoking for at least 15 years or develop a health problem that would prevent you from having lung cancer treatment.  Clinical breast exam.** / Every year after age 36 years.  BRCA-related cancer risk assessment.** / For women who have family members with a BRCA-related cancer (breast, ovarian, tubal, or peritoneal cancers).  Mammogram.** / Every year beginning at age 61 years and continuing for as long as you are in good health. Consult with your health care provider.  Pap test.** / Every 3 years starting at age 41 years through age 41 or 64 years with a history of 3 consecutive normal Pap tests.  HPV screening.** / Every 3 years from ages 45 years through ages 51 to 67 years with a history of 3 consecutive normal Pap tests.  Fecal occult blood test (FOBT) of stool. / Every year beginning at age 79 years and continuing until age 28 years. You may not need to do this test if you get a colonoscopy every 10 years.  Flexible sigmoidoscopy or colonoscopy.** / Every 5 years for a flexible sigmoidoscopy or every 10 years for a colonoscopy beginning at age 26 years and continuing until age 24 years.  Hepatitis C blood test.** / For all people born from [birth year] through  1965 and any individual with known risks for hepatitis C.  Skin self-exam. / Monthly.  Influenza vaccine. / Every year.  Tetanus, diphtheria, and acellular pertussis (Tdap/Td) vaccine.** / Consult your health care provider. Pregnant women should receive 1 dose of Tdap vaccine during each pregnancy. 1 dose of Td every 10 years.  Varicella vaccine.** / Consult your health care provider. Pregnant females who do not have evidence of immunity should receive the first dose after pregnancy.  Zoster vaccine.** / 1 dose for adults aged 14 years or older.  Measles, mumps, rubella (MMR) vaccine.** / You need at least 1 dose of MMR if you were born in 1957 or later. You may also need a 2nd dose. For females of childbearing age, rubella immunity should be determined. If there is no evidence of immunity, females who are not pregnant should be vaccinated. If there is no evidence of immunity, females who are pregnant should delay immunization until after pregnancy.  Pneumococcal 13-valent conjugate (PCV13) vaccine.** / Consult your health care provider.  Pneumococcal polysaccharide (PPSV23) vaccine.** / 1 to 2 doses if you smoke cigarettes or if you have certain conditions.  Meningococcal vaccine.** / Consult your health care provider.  Hepatitis A vaccine.** / Consult your health care provider.  Hepatitis B vaccine.** / Consult your health care provider.  Haemophilus influenzae type b (Hib) vaccine.** / Consult your health care provider. Ages 65  years and over  Blood pressure check.** / Every 1 to 2 years.  Lipid and cholesterol check.** / Every 5 years beginning at age 22 years.  Lung cancer screening. / Every year if you are aged 73-80 years and have a 30-pack-year history of smoking and currently smoke or have quit within the past 15 years. Yearly screening is stopped once you have quit smoking for at least 15 years or develop a health problem that would prevent you from having lung cancer  treatment.  Clinical breast exam.** / Every year after age 4 years.  BRCA-related cancer risk assessment.** / For women who have family members with a BRCA-related cancer (breast, ovarian, tubal, or peritoneal cancers).  Mammogram.** / Every year beginning at age 40 years and continuing for as long as you are in good health. Consult with your health care provider.  Pap test.** / Every 3 years starting at age 9 years through age 34 or 91 years with 3 consecutive normal Pap tests. Testing can be stopped between 65 and 70 years with 3 consecutive normal Pap tests and no abnormal Pap or HPV tests in the past 10 years.  HPV screening.** / Every 3 years from ages 57 years through ages 64 or 45 years with a history of 3 consecutive normal Pap tests. Testing can be stopped between 65 and 70 years with 3 consecutive normal Pap tests and no abnormal Pap or HPV tests in the past 10 years.  Fecal occult blood test (FOBT) of stool. / Every year beginning at age 15 years and continuing until age 17 years. You may not need to do this test if you get a colonoscopy every 10 years.  Flexible sigmoidoscopy or colonoscopy.** / Every 5 years for a flexible sigmoidoscopy or every 10 years for a colonoscopy beginning at age 86 years and continuing until age 71 years.  Hepatitis C blood test.** / For all people born from [birth year] through 1963 and any individual with known risks for hepatitis C.  Osteoporosis screening.** / A one-time screening for women ages 83 years and over and women at risk for fractures or osteoporosis.  Skin self-exam. / Monthly.  Influenza vaccine. / Every year.  Tetanus, diphtheria, and acellular pertussis (Tdap/Td) vaccine.** / 1 dose of Td every 10 years.  Varicella vaccine.** / Consult your health care provider.  Zoster vaccine.** / 1 dose for adults aged 61 years or older.  Pneumococcal 13-valent conjugate (PCV13) vaccine.** / Consult your health care provider.  Pneumococcal  polysaccharide (PPSV23) vaccine.** / 1 dose for all adults aged 28 years and older.  Meningococcal vaccine.** / Consult your health care provider.  Hepatitis A vaccine.** / Consult your health care provider.  Hepatitis B vaccine.** / Consult your health care provider.  Haemophilus influenzae type b (Hib) vaccine.** / Consult your health care provider. ** Family history and personal history of risk and conditions may change your health care provider's recommendations. Document Released: 11/11/2001 Document Revised: 01/30/2014 Document Reviewed: 02/10/2011 [facility] Patient Information 2015 [place], [place]. This information is not intended to replace advice given to you by your health care provider. Make sure you discuss any questions you have with your health care provider.

## 2015-04-25 ENCOUNTER — Encounter: Payer: Self-pay | Admitting: Family Medicine

## 2015-04-25 LAB — CBC
HEMATOCRIT: 43.7 % (ref 36.0–46.0)
Hemoglobin: 14.7 g/dL (ref 12.0–15.0)
MCH: 32.8 pg (ref 26.0–34.0)
MCHC: 33.6 g/dL (ref 30.0–36.0)
MCV: 97.5 fL (ref 78.0–100.0)
MPV: 10 fL (ref 8.6–12.4)
Platelets: 246 10*3/uL (ref 150–400)
RBC: 4.48 MIL/uL (ref 3.87–5.11)
RDW: 13.1 % (ref 11.5–15.5)
WBC: 6.6 10*3/uL (ref 4.0–10.5)

## 2015-04-25 LAB — TSH: TSH: 0.57 u[IU]/mL (ref 0.350–4.500)

## 2015-05-09 ENCOUNTER — Other Ambulatory Visit (HOSPITAL_COMMUNITY): Payer: Self-pay | Admitting: Nurse Practitioner

## 2015-05-09 DIAGNOSIS — B182 Chronic viral hepatitis C: Secondary | ICD-10-CM

## 2015-05-30 ENCOUNTER — Other Ambulatory Visit: Payer: Self-pay | Admitting: *Deleted

## 2015-05-30 DIAGNOSIS — M1990 Unspecified osteoarthritis, unspecified site: Secondary | ICD-10-CM

## 2015-05-30 MED ORDER — TRAMADOL HCL 50 MG PO TABS: 50.0000 mg | ORAL_TABLET | Freq: Four times a day (QID) | ORAL | Status: DC | PRN

## 2015-05-31 NOTE — Telephone Encounter (Signed)
Gave verbal for rx to Nicole [facility]red at pharmacy. Darryle Dennie, CMA.

## 2015-06-06 ENCOUNTER — Ambulatory Visit (HOSPITAL_COMMUNITY): Payer: BLUE CROSS/BLUE SHIELD

## 2015-06-20 ENCOUNTER — Ambulatory Visit (HOSPITAL_COMMUNITY): Payer: BLUE CROSS/BLUE SHIELD

## 2015-09-19 ENCOUNTER — Other Ambulatory Visit: Payer: Self-pay | Admitting: *Deleted

## 2015-09-19 DIAGNOSIS — M1990 Unspecified osteoarthritis, unspecified site: Secondary | ICD-10-CM

## 2015-09-19 MED ORDER — TRAMADOL HCL 50 MG PO TABS: 50.0000 mg | ORAL_TABLET | Freq: Four times a day (QID) | ORAL | Status: DC | PRN

## 2015-09-19 NOTE — Telephone Encounter (Signed)
Medication called into pharmacy and left message for patient making her aware of this. Demetrick Eichenberger,CMA

## 2016-01-10 ENCOUNTER — Encounter: Payer: Self-pay | Admitting: Family Medicine

## 2016-01-10 ENCOUNTER — Ambulatory Visit (INDEPENDENT_AMBULATORY_CARE_PROVIDER_SITE_OTHER): Payer: BLUE CROSS/BLUE SHIELD | Admitting: Family Medicine

## 2016-01-10 VITALS — BP 118/70 | Temp 98.6°F | Wt 142.8 lb

## 2016-01-10 DIAGNOSIS — K732 Chronic active hepatitis, not elsewhere classified: Secondary | ICD-10-CM

## 2016-01-10 DIAGNOSIS — R634 Abnormal weight loss: Secondary | ICD-10-CM

## 2016-01-10 DIAGNOSIS — B182 Chronic viral hepatitis C: Secondary | ICD-10-CM

## 2016-01-15 NOTE — Assessment & Plan Note (Signed)
S/p treatment again--to f/u with primary MD prn

## 2016-01-15 NOTE — Progress Notes (Signed)
    Subjective:    Patient ID: Taylor Walker is a 60 y.o. female presenting with Weight Loss  on 01/10/2016  HPI: Had visit at wellness center at work. Noted 13 # weight loss. Here because she is worried about this. Has completed Harvoni and her hep c levels are undetectable. To f/u with MD later this summer.  Review of Systems  Constitutional: Negative for fever and chills.  Respiratory: Negative for shortness of breath.   Cardiovascular: Negative for chest pain.  Gastrointestinal: Negative for nausea, vomiting and abdominal pain.  Genitourinary: Negative for dysuria.  Skin: Negative for rash.      Objective:    BP 118/70 mmHg  Temp(Src) 98.6 F (37 C) (Oral)  Wt 142 lb 12.8 oz (64.774 kg) Physical Exam  Constitutional: She is oriented to person, place, and time. She appears well-developed and well-nourished. No distress.  HENT:  Head: Normocephalic and atraumatic.  Eyes: No scleral icterus.  Neck: Neck supple.  Cardiovascular: Normal rate.   Pulmonary/Chest: Effort normal.  Abdominal: Soft.  Neurological: She is alert and oriented to person, place, and time.  Skin: Skin is warm and dry.  Psychiatric: She has a normal mood and affect.        Assessment & Plan:   Problem List Items Addressed This Visit      Unprioritized   Chronic active hepatitis C-genotype 1a - Primary    S/p treatment again--to f/u with primary MD prn       Other Visit Diagnoses    Loss of weight        down only 4 lbs by our scale--reassured.       Total face-to-face time with patient: 15 minutes. Over 50% of encounter was spent on counseling and coordination of care. Return if symptoms worsen or fail to improve.  PRATT,TANYA S 01/15/2016 9:16 PM

## 2016-01-17 ENCOUNTER — Ambulatory Visit: Payer: Self-pay | Admitting: Family Medicine

## 2016-02-19 ENCOUNTER — Other Ambulatory Visit: Payer: Self-pay | Admitting: *Deleted

## 2016-02-19 DIAGNOSIS — M1990 Unspecified osteoarthritis, unspecified site: Secondary | ICD-10-CM

## 2016-02-19 MED ORDER — TRAMADOL HCL 50 MG PO TABS: 50.0000 mg | ORAL_TABLET | Freq: Four times a day (QID) | ORAL | Status: DC | PRN

## 2016-02-19 NOTE — Telephone Encounter (Signed)
Called in for pt. Pt informed. Page, cma.

## 2016-03-18 ENCOUNTER — Other Ambulatory Visit: Payer: Self-pay | Admitting: Family Medicine

## 2016-03-18 DIAGNOSIS — Z1231 Encounter for screening mammogram for malignant neoplasm of breast: Secondary | ICD-10-CM

## 2016-04-23 ENCOUNTER — Ambulatory Visit (INDEPENDENT_AMBULATORY_CARE_PROVIDER_SITE_OTHER): Payer: BLUE CROSS/BLUE SHIELD | Admitting: Family Medicine

## 2016-04-23 ENCOUNTER — Ambulatory Visit
Admission: RE | Admit: 2016-04-23 | Discharge: 2016-04-23 | Disposition: A | Discharge status: Home / Self Care | Payer: BLUE CROSS/BLUE SHIELD | Source: Ambulatory Visit | Attending: Family Medicine | Admitting: Family Medicine

## 2016-04-23 ENCOUNTER — Encounter: Payer: Self-pay | Admitting: Family Medicine

## 2016-04-23 VITALS — BP 123/83 | HR 79 | Temp 97.8°F | Ht 68.0 in | Wt 146.0 lb

## 2016-04-23 DIAGNOSIS — B182 Chronic viral hepatitis C: Secondary | ICD-10-CM

## 2016-04-23 DIAGNOSIS — K732 Chronic active hepatitis, not elsewhere classified: Secondary | ICD-10-CM

## 2016-04-23 DIAGNOSIS — Z0000 Encounter for general adult medical examination without abnormal findings: Secondary | ICD-10-CM

## 2016-04-23 DIAGNOSIS — F17200 Nicotine dependence, unspecified, uncomplicated: Secondary | ICD-10-CM

## 2016-04-23 DIAGNOSIS — Z1231 Encounter for screening mammogram for malignant neoplasm of breast: Secondary | ICD-10-CM

## 2016-04-23 DIAGNOSIS — M1990 Unspecified osteoarthritis, unspecified site: Secondary | ICD-10-CM | POA: Diagnosis not present

## 2016-04-23 DIAGNOSIS — I2510 Atherosclerotic heart disease of native coronary artery without angina pectoris: Secondary | ICD-10-CM | POA: Diagnosis not present

## 2016-04-23 MED ORDER — TRAMADOL HCL 50 MG PO TABS: 50.0000 mg | ORAL_TABLET | Freq: Four times a day (QID) | ORAL | 3 refills | Status: DC | PRN

## 2016-04-23 NOTE — Assessment & Plan Note (Signed)
Med refill

## 2016-04-23 NOTE — Assessment & Plan Note (Signed)
S/p treatment with Harvoni and undetectable viral load

## 2016-04-23 NOTE — Assessment & Plan Note (Signed)
Has no interest in stopping smoking at this time.

## 2016-04-23 NOTE — Patient Instructions (Addendum)
Arthritis Arthritis is a term that is commonly used to refer to joint pain or joint disease. There are more than 100 types of arthritis. CAUSES The most common cause of this condition is wear and tear of a joint. Other causes include:  Gout.  Inflammation of a joint.  An infection of a joint.  Sprains and other injuries near the joint.  A drug reaction or allergic reaction. In some cases, the cause may not be known. SYMPTOMS The main symptom of this condition is pain in the joint with movement. Other symptoms include:  Redness, swelling, or stiffness at a joint.  Warmth coming from the joint.  Fever.  Overall feeling of illness. DIAGNOSIS This condition may be diagnosed with a physical exam and tests, including:  Blood tests.  Urine tests.  Imaging tests, such as MRI, X-rays, or a CT scan. Sometimes, fluid is removed from a joint for testing. TREATMENT Treatment for this condition may involve:  Treatment of the cause, if it is known.  Rest.  Raising (elevating) the joint.  Applying cold or hot packs to the joint.  Medicines to improve symptoms and reduce inflammation.  Injections of a steroid such as cortisone into the joint to help reduce pain and inflammation. Depending on the cause of your arthritis, you may need to make lifestyle changes to reduce stress on your joint. These changes may include exercising more and losing weight. HOME CARE INSTRUCTIONS Medicines  Take over-the-counter and prescription medicines only as told by your health care provider.  Do not take aspirin to relieve pain if gout is suspected. Activities  Rest your joint if told by your health care provider. Rest is important when your disease is active and your joint feels painful, swollen, or stiff.  Avoid activities that make the pain worse. It is important to balance activity with rest.  Exercise your joint regularly with range-of-motion exercises as told by your health care  provider. Try doing low-impact exercise, such as:  Swimming.  Water aerobics.  Biking.  Walking. Joint Care  If your joint is swollen, keep it elevated if told by your health care provider.  If your joint feels stiff in the morning, try taking a warm shower.  If directed, apply heat to the joint. If you have diabetes, do not apply heat without permission from your health care provider.  Put a towel between the joint and the hot pack or heating pad.  Leave the heat on the area for 20-30 minutes.  If directed, apply ice to the joint:  Put ice in a plastic bag.  Place a towel between your skin and the bag.  Leave the ice on for 20 minutes, 2-3 times per day.  Keep all follow-up visits as told by your health care provider. This is important. SEEK MEDICAL CARE IF:  The pain gets worse.  You have a fever. SEEK IMMEDIATE MEDICAL CARE IF:  You develop severe joint pain, swelling, or redness.  Many joints become painful and swollen.  You develop severe back pain.  You develop severe weakness in your leg.  You cannot control your bladder or bowels.   This information is not intended to replace advice given to you by your health care provider. Make sure you discuss any questions you have with your health care provider.   Document Released: 10/23/2004 Document Revised: 06/06/2015 Document Reviewed: 12/11/2014 Elsevier Interactive Patient Education 2016 [employer] Smoking Cessation, Tips for Success If you are ready to quit smoking, congratulations! You have   chosen to help yourself be healthier. Cigarettes bring nicotine, tar, carbon monoxide, and other irritants into your body. Your lungs, heart, and blood vessels will be able to work better without these poisons. There are many different ways to quit smoking. Nicotine gum, nicotine patches, a nicotine inhaler, or nicotine nasal spray can help with physical craving. Hypnosis, support groups, and medicines help break the  habit of smoking. WHAT THINGS CAN I DO TO MAKE QUITTING EASIER?  Here are some tips to help you quit for good:  Pick a date when you will quit smoking completely. Tell all of your friends and family about your plan to quit on that date.  Do not try to slowly cut down on the number of cigarettes you are smoking. Pick a quit date and quit smoking completely starting on that day.  Throw away all cigarettes.   Clean and remove all ashtrays from your home, work, and car.  On a card, write down your reasons for quitting. Carry the card with you and read it when you get the urge to smoke.  Cleanse your body of nicotine. Drink enough water and fluids to keep your urine clear or pale yellow. Do this after quitting to flush the nicotine from your body.  Learn to predict your moods. Do not let a bad situation be your excuse to have a cigarette. Some situations in your life might tempt you into wanting a cigarette.  Never have "just one" cigarette. It leads to wanting another and another. Remind yourself of your decision to quit.  Change habits associated with smoking. If you smoked while driving or when feeling stressed, try other activities to replace smoking. Stand up when drinking your coffee. Brush your teeth after eating. Sit in a different chair when you read the paper. Avoid alcohol while trying to quit, and try to drink fewer caffeinated beverages. Alcohol and caffeine may urge you to smoke.  Avoid foods and drinks that can trigger a desire to smoke, such as sugary or spicy foods and alcohol.  Ask people who smoke not to smoke around you.  Have something planned to do right after eating or having a cup of coffee. For example, plan to take a walk or exercise.  Try a relaxation exercise to calm you down and decrease your stress. Remember, you may be tense and nervous for the first 2 weeks after you quit, but this will pass.  Find new activities to keep your hands busy. Play with a pen,  coin, or rubber band. Doodle or draw things on paper.  Brush your teeth right after eating. This will help cut down on the craving for the taste of tobacco after meals. You can also try mouthwash.   Use oral substitutes in place of cigarettes. Try using lemon drops, carrots, cinnamon sticks, or chewing gum. Keep them handy so they are available when you have the urge to smoke.  When you have the urge to smoke, try deep breathing.  Designate your home as a nonsmoking area.  If you are a heavy smoker, ask your health care provider about a prescription for nicotine chewing gum. It can ease your withdrawal from nicotine.  Reward yourself. Set aside the cigarette money you save and buy yourself something nice.  Look for support from others. Join a support group or smoking cessation program. Ask someone at home or at work to help you with your plan to quit smoking.  Always ask yourself, "Do I need this cigarette or is  this just a reflex?" Tell yourself, "Today, I choose not to smoke," or "I do not want to smoke." You are reminding yourself of your decision to quit.  Do not replace cigarette smoking with electronic cigarettes (commonly called e-cigarettes). The safety of e-cigarettes is unknown, and some may contain harmful chemicals.  If you relapse, do not give up! Plan ahead and think about what you will do the next time you get the urge to smoke. HOW WILL I FEEL WHEN I QUIT SMOKING? You may have symptoms of withdrawal because your body is used to nicotine (the addictive substance in cigarettes). You may crave cigarettes, be irritable, feel very hungry, cough often, get headaches, or have difficulty concentrating. The withdrawal symptoms are only temporary. They are strongest when you first quit but will go away within 10-14 days. When withdrawal symptoms occur, stay in control. Think about your reasons for quitting. Remind yourself that these are signs that your body is healing and getting used  to being without cigarettes. Remember that withdrawal symptoms are easier to treat than the major diseases that smoking can cause.  Even after the withdrawal is over, expect periodic urges to smoke. However, these cravings are generally short lived and will go away whether you smoke or not. Do not smoke! WHAT RESOURCES ARE AVAILABLE TO HELP ME QUIT SMOKING? Your health care provider can direct you to community resources or hospitals for support, which may include:  Group support.  Education.  Hypnosis.  Therapy.   This information is not intended to replace advice given to you by your health care provider. Make sure you discuss any questions you have with your health care provider.   Document Released: 06/13/2004 Document Revised: 10/06/2014 Document Reviewed: 03/03/2013 Elsevier Interactive Patient Education [employer].

## 2016-04-23 NOTE — Progress Notes (Signed)
   Subjective:    Patient ID: Taylor Walker is a 60 y.o. female presenting with Annual Exam  on 04/23/2016  HPI: Here for medical exam. Doing well. Reports no issues.  Review of Systems  Constitutional: Negative for chills and fever.  HENT: Negative for congestion, nosebleeds and rhinorrhea.   Eyes: Negative for visual disturbance.  Respiratory: Negative for chest tightness and shortness of breath.   Cardiovascular: Negative for chest pain.  Gastrointestinal: Negative for abdominal distention, abdominal pain, blood in stool, constipation, diarrhea, nausea and vomiting.  Genitourinary: Negative for dysuria, frequency, menstrual problem and vaginal bleeding.  Musculoskeletal: Negative for arthralgias.  Skin: Negative for rash.  Neurological: Negative for dizziness and headaches.  Psychiatric/Behavioral: Negative for behavioral problems, dysphoric mood and sleep disturbance.  All other systems reviewed and are negative.     Objective:    BP 123/83   Pulse 79   Temp 97.8 F (36.6 C) (Oral)   Ht 5\' 8"  (1.727 m)   Wt 146 lb (66.2 kg)   BMI 22.20 kg/m  Physical Exam  Constitutional: She is oriented to person, place, and time. She appears well-developed and well-nourished.  HENT:  Head: Normocephalic and atraumatic.  Eyes: Pupils are equal, round, and reactive to light. No scleral icterus.  Neck: Normal range of motion. No thyromegaly present.  Cardiovascular: Normal rate, regular rhythm and intact distal pulses.   Pulmonary/Chest: Effort normal and breath sounds normal.  Abdominal: Soft. She exhibits no distension. There is no tenderness.  Neurological: She is alert and oriented to person, place, and time.  Skin: Skin is warm and dry.        Assessment & Plan:   Problem List Items Addressed This Visit      Unprioritized   Chronic active hepatitis C-genotype 1a    S/p treatment with Harvoni and undetectable viral load      TOBACCO USE DISORDER/SMOKER-SMOKING  CESSATION DISCUSSED - Primary    Has no interest in stopping smoking at this time.      Coronary atherosclerosis    To f/u with cards this year--no visit in 2 years--continue ASA and NTG prn. Continue Lipitor--excellent lipids--repeat in 1 year.      Arthritis    Med refill      Relevant Medications   traMADol (ULTRAM) 50 MG tablet    Other Visit Diagnoses    Routine general medical examination at a health care facility       anticipatory guidance--mammogram today       Return in about 1 year (around 04/23/2017).  Juanita Devincent S 04/23/2016 4:03 PM

## 2016-04-23 NOTE — Assessment & Plan Note (Addendum)
To f/u with cards this year--no visit in 2 years--continue ASA and NTG prn. Continue Lipitor--excellent lipids--repeat in 1 year.

## 2016-05-30 ENCOUNTER — Other Ambulatory Visit: Payer: Self-pay | Admitting: Family Medicine

## 2016-05-30 DIAGNOSIS — I2510 Atherosclerotic heart disease of native coronary artery without angina pectoris: Secondary | ICD-10-CM

## 2016-06-23 ENCOUNTER — Other Ambulatory Visit: Payer: Self-pay | Admitting: *Deleted

## 2016-06-23 DIAGNOSIS — M1990 Unspecified osteoarthritis, unspecified site: Secondary | ICD-10-CM

## 2016-06-23 MED ORDER — TRAMADOL HCL 50 MG PO TABS: 50.0000 mg | ORAL_TABLET | Freq: Four times a day (QID) | ORAL | 3 refills | Status: DC | PRN

## 2016-06-23 NOTE — Telephone Encounter (Signed)
Rx called in to pharmacy. 

## 2016-09-29 ENCOUNTER — Other Ambulatory Visit: Payer: Self-pay | Admitting: Family Medicine

## 2016-09-29 DIAGNOSIS — I2510 Atherosclerotic heart disease of native coronary artery without angina pectoris: Secondary | ICD-10-CM

## 2016-11-12 DIAGNOSIS — E7800 Pure hypercholesterolemia, unspecified: Secondary | ICD-10-CM | POA: Insufficient documentation

## 2016-11-27 ENCOUNTER — Other Ambulatory Visit: Payer: Self-pay | Admitting: *Deleted

## 2016-11-27 DIAGNOSIS — M1990 Unspecified osteoarthritis, unspecified site: Secondary | ICD-10-CM

## 2016-11-28 MED ORDER — TRAMADOL HCL 50 MG PO TABS: 50.0000 mg | ORAL_TABLET | Freq: Four times a day (QID) | ORAL | 3 refills | Status: DC | PRN

## 2016-12-02 NOTE — Telephone Encounter (Signed)
Called into pharmacy for pt- a VM was left for pharmacy staff.

## 2017-02-11 ENCOUNTER — Other Ambulatory Visit: Payer: Self-pay | Admitting: Family Medicine

## 2017-02-11 DIAGNOSIS — I2510 Atherosclerotic heart disease of native coronary artery without angina pectoris: Secondary | ICD-10-CM

## 2017-03-04 ENCOUNTER — Other Ambulatory Visit: Payer: Self-pay | Admitting: Family Medicine

## 2017-03-04 DIAGNOSIS — Z1231 Encounter for screening mammogram for malignant neoplasm of breast: Secondary | ICD-10-CM

## 2017-04-23 ENCOUNTER — Encounter: Payer: Self-pay | Admitting: Family Medicine

## 2017-04-23 ENCOUNTER — Ambulatory Visit (INDEPENDENT_AMBULATORY_CARE_PROVIDER_SITE_OTHER): Payer: BLUE CROSS/BLUE SHIELD | Admitting: Family Medicine

## 2017-04-23 VITALS — BP 120/60 | HR 87 | Temp 98.0°F | Ht 68.0 in | Wt 147.4 lb

## 2017-04-23 DIAGNOSIS — Z0000 Encounter for general adult medical examination without abnormal findings: Secondary | ICD-10-CM | POA: Diagnosis not present

## 2017-04-23 DIAGNOSIS — I2510 Atherosclerotic heart disease of native coronary artery without angina pectoris: Secondary | ICD-10-CM

## 2017-04-23 DIAGNOSIS — F17200 Nicotine dependence, unspecified, uncomplicated: Secondary | ICD-10-CM

## 2017-04-23 DIAGNOSIS — B182 Chronic viral hepatitis C: Secondary | ICD-10-CM

## 2017-04-23 DIAGNOSIS — K732 Chronic active hepatitis, not elsewhere classified: Secondary | ICD-10-CM

## 2017-04-23 MED ORDER — NITROGLYCERIN 0.4 MG SL SUBL: 0.4000 mg | SUBLINGUAL_TABLET | SUBLINGUAL | 12 refills | Status: DC | PRN

## 2017-04-23 NOTE — Assessment & Plan Note (Signed)
Not interested in smoking cessation at this time

## 2017-04-23 NOTE — Assessment & Plan Note (Addendum)
To see cards. Continue ASA and Lipitor. NTG refilled. Check lipids when fasting.

## 2017-04-23 NOTE — Progress Notes (Signed)
   Subjective:    Patient ID: Taylor Walker is a 61 y.o. female presenting with Annual Exam  on 04/23/2017  HPI: Here for annual exam. Has f/u with GI next week. Scheduled for mammogram next week.  S/p hysterectomy Sees cards--will schedule f/u Has appt with optho.  Review of Systems  Constitutional: Negative for chills and fever.  HENT: Negative for congestion, nosebleeds and rhinorrhea.   Eyes: Negative for visual disturbance.  Respiratory: Negative for chest tightness and shortness of breath.   Cardiovascular: Negative for chest pain.  Gastrointestinal: Negative for abdominal distention, abdominal pain, blood in stool, constipation, diarrhea, nausea and vomiting.  Genitourinary: Negative for dysuria, frequency, menstrual problem and vaginal bleeding.  Musculoskeletal: Negative for arthralgias.  Skin: Negative for rash.  Neurological: Negative for dizziness and headaches.  Psychiatric/Behavioral: Negative for behavioral problems, dysphoric mood and sleep disturbance.  All other systems reviewed and are negative.     Objective:    BP 120/60   Pulse 87   Temp 98 F (36.7 C) (Oral)   Ht 5\' 8"  (1.727 m)   Wt 147 lb 6.4 oz (66.9 kg)   SpO2 99%   BMI 22.41 kg/m  Physical Exam  Constitutional: She is oriented to person, place, and time. She appears well-developed and well-nourished.  HENT:  Head: Normocephalic and atraumatic.  Eyes: Pupils are equal, round, and reactive to light. No scleral icterus.  Neck: Normal range of motion. No thyromegaly present.  Cardiovascular: Normal rate, regular rhythm and intact distal pulses.   Pulmonary/Chest: Effort normal and breath sounds normal.  Abdominal: Soft. She exhibits no distension. There is no tenderness.  Neurological: She is alert and oriented to person, place, and time.  Skin: Skin is warm and dry.      Assessment & Plan:   Problem List Items Addressed This Visit      Unprioritized   Chronic active hepatitis  C-genotype 1a    Has GI f/u      TOBACCO USE DISORDER/SMOKER-SMOKING CESSATION DISCUSSED    Not interested in smoking cessation at this time      Coronary atherosclerosis    To see cards. Continue ASA and Lipitor. NTG refilled. Check lipids when fasting.      Relevant Medications   nitroGLYCERIN (NITROSTAT) 0.4 MG SL tablet    Other Visit Diagnoses    Annual physical exam    -  Primary   Relevant Orders   CBC   TSH   Hemoglobin A1c   Comprehensive metabolic panel   Lipid panel     Return in 1 year (on 04/23/2018).  [name] 04/23/2017 5:41 PM

## 2017-04-23 NOTE — Patient Instructions (Addendum)
Preventive Care 40-64 Years, Female Preventive care refers to lifestyle choices and visits with your health care provider that can promote health and wellness. What does preventive care include?  A yearly physical exam. This is also called an annual well check.  Dental exams once or twice a year.  Routine eye exams. Ask your health care provider how often you should have your eyes checked.  Personal lifestyle choices, including: ? Daily care of your teeth and gums. ? Regular physical activity. ? Eating a healthy diet. ? Avoiding tobacco and drug use. ? Limiting alcohol use. ? Practicing safe sex. ? Taking low-dose aspirin daily starting at age 58. ? Taking vitamin and mineral supplements as recommended by your health care provider. What happens during an annual well check? The services and screenings done by your health care provider during your annual well check will depend on your age, overall health, lifestyle risk factors, and family history of disease. Counseling Your health care provider may ask you questions about your:  Alcohol use.  Tobacco use.  Drug use.  Emotional well-being.  Home and relationship well-being.  Sexual activity.  Eating habits.  Work and work Statistician.  Method of birth control.  Menstrual cycle.  Pregnancy history.  Screening You may have the following tests or measurements:  Height, weight, and BMI.  Blood pressure.  Lipid and cholesterol levels. These may be checked every 5 years, or more frequently if you are over 81 years old.  Skin check.  Lung cancer screening. You may have this screening every year starting at age 78 if you have a 30-pack-year history of smoking and currently smoke or have quit within the past 15 years.  Fecal occult blood test (FOBT) of the stool. You may have this test every year starting at age 65.  Flexible sigmoidoscopy or colonoscopy. You may have a sigmoidoscopy every 5 years or a colonoscopy  every 10 years starting at age 30.  Hepatitis C blood test.  Hepatitis B blood test.  Sexually transmitted disease (STD) testing.  Diabetes screening. This is done by checking your blood sugar (glucose) after you have not eaten for a while (fasting). You may have this done every 1-3 years.  Mammogram. This may be done every 1-2 years. Talk to your health care provider about when you should start having regular mammograms. This may depend on whether you have a family history of breast cancer.  BRCA-related cancer screening. This may be done if you have a family history of breast, ovarian, tubal, or peritoneal cancers.  Pelvic exam and Pap test. This may be done every 3 years starting at age 80. Starting at age 36, this may be done every 5 years if you have a Pap test in combination with an HPV test.  Bone density scan. This is done to screen for osteoporosis. You may have this scan if you are at high risk for osteoporosis.  Discuss your test results, treatment options, and if necessary, the need for more tests with your health care provider. Vaccines Your health care provider may recommend certain vaccines, such as:  Influenza vaccine. This is recommended every year.  Tetanus, diphtheria, and acellular pertussis (Tdap, Td) vaccine. You may need a Td booster every 10 years.  Varicella vaccine. You may need this if you have not been vaccinated.  Zoster vaccine. You may need this after age 5.  Measles, mumps, and rubella (MMR) vaccine. You may need at least one dose of MMR if you were born in  1957 or later. You may also need a second dose.  Pneumococcal 13-valent conjugate (PCV13) vaccine. You may need this if you have certain conditions and were not previously vaccinated.  Pneumococcal polysaccharide (PPSV23) vaccine. You may need one or two doses if you smoke cigarettes or if you have certain conditions.  Meningococcal vaccine. You may need this if you have certain  conditions.  Hepatitis A vaccine. You may need this if you have certain conditions or if you travel or work in places where you may be exposed to hepatitis A.  Hepatitis B vaccine. You may need this if you have certain conditions or if you travel or work in places where you may be exposed to hepatitis B.  Haemophilus influenzae type b (Hib) vaccine. You may need this if you have certain conditions.  Talk to your health care provider about which screenings and vaccines you need and how often you need them. This information is not intended to replace advice given to you by your health care provider. Make sure you discuss any questions you have with your health care provider. Document Released: 10/12/2015 Document Revised: 06/04/2016 Document Reviewed: 07/17/2015 Elsevier Interactive Patient Education  2017 [place] [employer], Female Adopting a healthy lifestyle and getting preventive care can go a long way to promote health and wellness. Talk with your health care provider about what schedule of regular examinations is right for you. This is a good chance for you to check in with your provider about disease prevention and staying healthy. In between checkups, there are plenty of things you can do on your own. Experts have done a lot of research about which lifestyle changes and preventive measures are most likely to keep you healthy. Ask your health care provider for more information. Weight and diet Eat a healthy diet  Be sure to include plenty of vegetables, fruits, low-fat dairy products, and lean protein.  Do not eat a lot of foods high in solid fats, added sugars, or salt.  Get regular exercise. This is one of the most important things you can do for your health. ? Most adults should exercise for at least 150 minutes each week. The exercise should increase your heart rate and make you sweat (moderate-intensity exercise). ? Most adults should also do strengthening  exercises at least twice a week. This is in addition to the moderate-intensity exercise.  Maintain a healthy weight  Body mass index (BMI) is a measurement that can be used to identify possible weight problems. It estimates body fat based on height and weight. Your health care provider can help determine your BMI and help you achieve or maintain a healthy weight.  For females 52 years of age and older: ? A BMI below 18.5 is considered underweight. ? A BMI of 18.5 to 24.9 is normal. ? A BMI of 25 to 29.9 is considered overweight. ? A BMI of 30 and above is considered obese.  Watch levels of cholesterol and blood lipids  You should start having your blood tested for lipids and cholesterol at 59 years of age, then have this test every 5 years.  You may need to have your cholesterol levels checked more often if: ? Your lipid or cholesterol levels are high. ? You are older than 59 years of age. ? You are at high risk for heart disease.  Cancer screening Lung Cancer  Lung cancer screening is recommended for adults 68-10 years old who are at high risk for lung cancer because  of a history of smoking.  A yearly low-dose CT scan of the lungs is recommended for people who: ? Currently smoke. ? Have quit within the past 15 years. ? Have at least a 30-pack-year history of smoking. A pack year is smoking an average of one pack of cigarettes a day for 1 year.  Yearly screening should continue until it has been 15 years since you quit.  Yearly screening should stop if you develop a health problem that would prevent you from having lung cancer treatment.  Breast Cancer  Practice breast self-awareness. This means understanding how your breasts normally appear and feel.  It also means doing regular breast self-exams. Let your health care provider know about any changes, no matter how small.  If you are in your 20s or 30s, you should have a clinical breast exam (CBE) by a health care provider  every 1-3 years as part of a regular health exam.  If you are 60 or older, have a CBE every year. Also consider having a breast X-ray (mammogram) every year.  If you have a family history of breast cancer, talk to your health care provider about genetic screening.  If you are at high risk for breast cancer, talk to your health care provider about having an MRI and a mammogram every year.  Breast cancer gene (BRCA) assessment is recommended for women who have family members with BRCA-related cancers. BRCA-related cancers include: ? Breast. ? Ovarian. ? Tubal. ? Peritoneal cancers.  Results of the assessment will determine the need for genetic counseling and BRCA1 and BRCA2 testing.  Cervical Cancer Your health care provider may recommend that you be screened regularly for cancer of the pelvic organs (ovaries, uterus, and vagina). This screening involves a pelvic examination, including checking for microscopic changes to the surface of your cervix (Pap test). You may be encouraged to have this screening done every 3 years, beginning at age 6.  For women ages 31-65, health care providers may recommend pelvic exams and Pap testing every 3 years, or they may recommend the Pap and pelvic exam, combined with testing for human papilloma virus (HPV), every 5 years. Some types of HPV increase your risk of cervical cancer. Testing for HPV may also be done on women of any age with unclear Pap test results.  Other health care providers may not recommend any screening for nonpregnant women who are considered low risk for pelvic cancer and who do not have symptoms. Ask your health care provider if a screening pelvic exam is right for you.  If you have had past treatment for cervical cancer or a condition that could lead to cancer, you need Pap tests and screening for cancer for at least 20 years after your treatment. If Pap tests have been discontinued, your risk factors (such as having a new sexual  partner) need to be reassessed to determine if screening should resume. Some women have medical problems that increase the chance of getting cervical cancer. In these cases, your health care provider may recommend more frequent screening and Pap tests.  Colorectal Cancer  This type of cancer can be detected and often prevented.  Routine colorectal cancer screening usually begins at 59 years of age and continues through 59 years of age.  Your health care provider may recommend screening at an earlier age if you have risk factors for colon cancer.  Your health care provider may also recommend using home test kits to check for hidden blood in the stool.  A small camera at the end of a tube can be used to examine your colon directly (sigmoidoscopy or colonoscopy). This is done to check for the earliest forms of colorectal cancer.  Routine screening usually begins at age 25.  Direct examination of the colon should be repeated every 5-10 years through 59 years of age. However, you may need to be screened more often if early forms of precancerous polyps or small growths are found.  Skin Cancer  Check your skin from head to toe regularly.  Tell your health care provider about any new moles or changes in moles, especially if there is a change in a mole's shape or color.  Also tell your health care provider if you have a mole that is larger than the size of a pencil eraser.  Always use sunscreen. Apply sunscreen liberally and repeatedly throughout the day.  Protect yourself by wearing long sleeves, pants, a wide-brimmed hat, and sunglasses whenever you are outside.  Heart disease, diabetes, and high blood pressure  High blood pressure causes heart disease and increases the risk of stroke. High blood pressure is more likely to develop in: ? People who have blood pressure in the high end of the normal range (130-139/85-89 mm Hg). ? People who are overweight or obese. ? People who are African  American.  If you are 53-32 years of age, have your blood pressure checked every 3-5 years. If you are 44 years of age or older, have your blood pressure checked every year. You should have your blood pressure measured twice-once when you are at a hospital or clinic, and once when you are not at a hospital or clinic. Record the average of the two measurements. To check your blood pressure when you are not at a hospital or clinic, you can use: ? An automated blood pressure machine at a pharmacy. ? A home blood pressure monitor.  If you are between 39 years and 6 years old, ask your health care provider if you should take aspirin to prevent strokes.  Have regular diabetes screenings. This involves taking a blood sample to check your fasting blood sugar level. ? If you are at a normal weight and have a low risk for diabetes, have this test once every three years after 59 years of age. ? If you are overweight and have a high risk for diabetes, consider being tested at a younger age or more often. Preventing infection Hepatitis B  If you have a higher risk for hepatitis B, you should be screened for this virus. You are considered at high risk for hepatitis B if: ? You were born in a country where hepatitis B is common. Ask your health care provider which countries are considered high risk. ? Your parents were born in a high-risk country, and you have not been immunized against hepatitis B (hepatitis B vaccine). ? You have HIV or AIDS. ? You use needles to inject street drugs. ? You live with someone who has hepatitis B. ? You have had sex with someone who has hepatitis B. ? You get hemodialysis treatment. ? You take certain medicines for conditions, including cancer, organ transplantation, and autoimmune conditions.  Hepatitis C  Blood testing is recommended for: ? Everyone born from [birth year] through 1963. ? Anyone with known risk factors for hepatitis C.  Sexually transmitted infections  (STIs)  You should be screened for sexually transmitted infections (STIs) including gonorrhea and chlamydia if: ? You are sexually active and are younger than 59  years of age. ? You are older than 58 years of age and your health care provider tells you that you are at risk for this type of infection. ? Your sexual activity has changed since you were last screened and you are at an increased risk for chlamydia or gonorrhea. Ask your health care provider if you are at risk.  If you do not have HIV, but are at risk, it may be recommended that you take a prescription medicine daily to prevent HIV infection. This is called pre-exposure prophylaxis (PrEP). You are considered at risk if: ? You are sexually active and do not regularly use condoms or know the HIV status of your partner(s). ? You take drugs by injection. ? You are sexually active with a partner who has HIV.  Talk with your health care provider about whether you are at high risk of being infected with HIV. If you choose to begin PrEP, you should first be tested for HIV. You should then be tested every 3 months for as long as you are taking PrEP. Pregnancy  If you are premenopausal and you may become pregnant, ask your health care provider about preconception counseling.  If you may become pregnant, take 400 to 800 micrograms (mcg) of folic acid every day.  If you want to prevent pregnancy, talk to your health care provider about birth control (contraception). Osteoporosis and menopause  Osteoporosis is a disease in which the bones lose minerals and strength with aging. This can result in serious bone fractures. Your risk for osteoporosis can be identified using a bone density scan.  If you are 97 years of age or older, or if you are at risk for osteoporosis and fractures, ask your health care provider if you should be screened.  Ask your health care provider whether you should take a calcium or vitamin D supplement to lower your risk  for osteoporosis.  Menopause may have certain physical symptoms and risks.  Hormone replacement therapy may reduce some of these symptoms and risks. Talk to your health care provider about whether hormone replacement therapy is right for you. Follow these instructions at home:  Schedule regular health, dental, and eye exams.  Stay current with your immunizations.  Do not use any tobacco products including cigarettes, chewing tobacco, or electronic cigarettes.  If you are pregnant, do not drink alcohol.  If you are breastfeeding, limit how much and how often you drink alcohol.  Limit alcohol intake to no more than 1 drink per day for nonpregnant women. One drink equals 12 ounces of beer, 5 ounces of wine, or 1 ounces of hard liquor.  Do not use street drugs.  Do not share needles.  Ask your health care provider for help if you need support or information about quitting drugs.  Tell your health care provider if you often feel depressed.  Tell your health care provider if you have ever been abused or do not feel safe at home. This information is not intended to replace advice given to you by your health care provider. Make sure you discuss any questions you have with your health care provider. Document Released: 03/31/2011 Document Revised: 02/21/2016 Document Reviewed: 06/19/2015 Elsevier Interactive Patient Education  [employer].

## 2017-04-23 NOTE — Assessment & Plan Note (Signed)
Has GI f/u

## 2017-04-27 ENCOUNTER — Other Ambulatory Visit: Payer: BLUE CROSS/BLUE SHIELD

## 2017-04-27 ENCOUNTER — Ambulatory Visit
Admission: RE | Admit: 2017-04-27 | Discharge: 2017-04-27 | Disposition: A | Discharge status: Home / Self Care | Payer: BLUE CROSS/BLUE SHIELD | Source: Ambulatory Visit | Attending: Family Medicine | Admitting: Family Medicine

## 2017-04-27 DIAGNOSIS — Z1231 Encounter for screening mammogram for malignant neoplasm of breast: Secondary | ICD-10-CM

## 2017-04-27 DIAGNOSIS — Z0000 Encounter for general adult medical examination without abnormal findings: Secondary | ICD-10-CM

## 2017-04-28 ENCOUNTER — Encounter: Payer: Self-pay | Admitting: Family Medicine

## 2017-04-28 LAB — COMPREHENSIVE METABOLIC PANEL
A/G RATIO: 1.8 (ref 1.2–2.2)
ALK PHOS: 38 IU/L — AB (ref 39–117)
ALT: 21 IU/L (ref 0–32)
AST: 20 IU/L (ref 0–40)
Albumin: 4.4 g/dL (ref 3.5–5.5)
BUN/Creatinine Ratio: 13 (ref 9–23)
BUN: 13 mg/dL (ref 6–24)
Bilirubin Total: 0.2 mg/dL (ref 0.0–1.2)
CALCIUM: 9.9 mg/dL (ref 8.7–10.2)
CO2: 26 mmol/L (ref 20–29)
Chloride: 104 mmol/L (ref 96–106)
Creatinine, Ser: 1 mg/dL (ref 0.57–1.00)
GFR calc Af Amer: 71 mL/min/{1.73_m2} (ref >59)
GFR, EST NON AFRICAN AMERICAN: 62 mL/min/{1.73_m2} (ref >59)
GLOBULIN, TOTAL: 2.5 g/dL (ref 1.5–4.5)
Glucose: 107 mg/dL — ABNORMAL HIGH (ref 65–99)
POTASSIUM: 5.1 mmol/L (ref 3.5–5.2)
SODIUM: 144 mmol/L (ref 134–144)
Total Protein: 6.9 g/dL (ref 6.0–8.5)

## 2017-04-28 LAB — LIPID PANEL
CHOLESTEROL TOTAL: 170 mg/dL (ref 100–199)
Chol/HDL Ratio: 2.6 ratio (ref 0.0–4.4)
HDL: 65 mg/dL (ref >39)
LDL Calculated: 94 mg/dL (ref 0–99)
TRIGLYCERIDES: 56 mg/dL (ref 0–149)
VLDL Cholesterol Cal: 11 mg/dL (ref 5–40)

## 2017-04-28 LAB — CBC
HEMATOCRIT: 42 % (ref 34.0–46.6)
HEMOGLOBIN: 14.4 g/dL (ref 11.1–15.9)
MCH: 33.4 pg — ABNORMAL HIGH (ref 26.6–33.0)
MCHC: 34.3 g/dL (ref 31.5–35.7)
MCV: 97 fL (ref 79–97)
Platelets: 219 10*3/uL (ref 150–379)
RBC: 4.31 x10E6/uL (ref 3.77–5.28)
RDW: 13.4 % (ref 12.3–15.4)
WBC: 6 10*3/uL (ref 3.4–10.8)

## 2017-04-28 LAB — HEMOGLOBIN A1C
ESTIMATED AVERAGE GLUCOSE: 117 mg/dL
HEMOGLOBIN A1C: 5.7 % — AB (ref 4.8–5.6)

## 2017-04-28 LAB — TSH: TSH: 0.784 u[IU]/mL (ref 0.450–4.500)

## 2017-05-03 ENCOUNTER — Other Ambulatory Visit: Payer: Self-pay | Admitting: Family Medicine

## 2017-05-03 DIAGNOSIS — M1990 Unspecified osteoarthritis, unspecified site: Secondary | ICD-10-CM

## 2017-05-04 NOTE — Telephone Encounter (Signed)
LM for patient that script was called into pharmacy. Katelen Luepke,CMA  

## 2017-05-05 ENCOUNTER — Telehealth: Payer: Self-pay | Admitting: Family Medicine

## 2017-05-05 NOTE — Telephone Encounter (Signed)
Pt would like her recent lab results mailed to her. Address on file is correct. ep

## 2017-05-05 NOTE — Telephone Encounter (Signed)
Labs printed and mailed to patient. Dustyn Armbrister,CMA  

## 2017-07-02 ENCOUNTER — Other Ambulatory Visit: Payer: Self-pay | Admitting: Family Medicine

## 2017-07-02 DIAGNOSIS — I2510 Atherosclerotic heart disease of native coronary artery without angina pectoris: Secondary | ICD-10-CM

## 2017-08-11 ENCOUNTER — Other Ambulatory Visit: Payer: Self-pay | Admitting: Family Medicine

## 2017-08-11 DIAGNOSIS — M1990 Unspecified osteoarthritis, unspecified site: Secondary | ICD-10-CM

## 2017-08-11 NOTE — Telephone Encounter (Signed)
Script called into pharmacy and left on their voicemail.  Patient informed. Jazmin Hartsell,CMA

## 2017-09-03 ENCOUNTER — Other Ambulatory Visit: Payer: Self-pay | Admitting: Family Medicine

## 2017-09-03 DIAGNOSIS — I2510 Atherosclerotic heart disease of native coronary artery without angina pectoris: Secondary | ICD-10-CM

## 2017-09-28 ENCOUNTER — Other Ambulatory Visit: Payer: Self-pay | Admitting: Family Medicine

## 2017-09-28 DIAGNOSIS — M1990 Unspecified osteoarthritis, unspecified site: Secondary | ICD-10-CM

## 2017-11-19 ENCOUNTER — Other Ambulatory Visit: Payer: Self-pay | Admitting: Family Medicine

## 2017-11-19 DIAGNOSIS — M1990 Unspecified osteoarthritis, unspecified site: Secondary | ICD-10-CM

## 2017-11-24 ENCOUNTER — Other Ambulatory Visit: Payer: Self-pay | Admitting: Family Medicine

## 2017-11-24 DIAGNOSIS — M1990 Unspecified osteoarthritis, unspecified site: Secondary | ICD-10-CM

## 2017-12-28 ENCOUNTER — Other Ambulatory Visit: Payer: Self-pay

## 2017-12-28 DIAGNOSIS — M1990 Unspecified osteoarthritis, unspecified site: Secondary | ICD-10-CM

## 2017-12-29 MED ORDER — TRAMADOL HCL 50 MG PO TABS: 50.0000 mg | ORAL_TABLET | Freq: Four times a day (QID) | ORAL | 0 refills | Status: DC | PRN

## 2018-02-12 ENCOUNTER — Other Ambulatory Visit: Payer: Self-pay | Admitting: *Deleted

## 2018-02-12 DIAGNOSIS — M1990 Unspecified osteoarthritis, unspecified site: Secondary | ICD-10-CM

## 2018-02-12 MED ORDER — TRAMADOL HCL 50 MG PO TABS: 50.0000 mg | ORAL_TABLET | Freq: Four times a day (QID) | ORAL | 0 refills | Status: DC | PRN

## 2018-02-23 ENCOUNTER — Encounter: Payer: Self-pay | Admitting: Internal Medicine

## 2018-02-23 ENCOUNTER — Ambulatory Visit: Payer: BLUE CROSS/BLUE SHIELD | Admitting: Internal Medicine

## 2018-02-23 ENCOUNTER — Other Ambulatory Visit: Payer: Self-pay

## 2018-02-23 VITALS — BP 122/80 | HR 88 | Temp 98.3°F | Ht 68.0 in | Wt 142.8 lb

## 2018-02-23 DIAGNOSIS — T148XXA Other injury of unspecified body region, initial encounter: Secondary | ICD-10-CM

## 2018-02-23 MED ORDER — BACLOFEN 5 MG PO TABS: 5.0000 mg | ORAL_TABLET | Freq: Three times a day (TID) | ORAL | 0 refills | Status: DC | PRN

## 2018-02-23 MED ORDER — NAPROXEN 500 MG PO TABS: 500.0000 mg | ORAL_TABLET | Freq: Two times a day (BID) | ORAL | 0 refills | Status: DC

## 2018-02-23 NOTE — Patient Instructions (Signed)
Taylor Walker,  Please try the muscle relaxant baclofen 5 mg up to 3 times a day. Continue naprosyn 500 mg twice daily. You may find relief from a heated cream like capsaicin (this is over-the-counter).   If you continue to have pain after another few weeks, it may help to try physical therapy. Please see me back in 2-3 weeks if things are not improving.  Best, Dr. Ola Spurr   Cervical Sprain A cervical sprain is a stretch or tear in the tissues that connect bones (ligaments) in the neck. Most neck (cervical) sprains get better in 4-6 weeks. Follow these instructions at home: If you have a neck collar:  Wear it as told by your doctor. Do not take off (do not remove) the collar unless your doctor says that this is safe.  Ask your doctor before adjusting your collar.  If you have long hair, keep it outside of the collar.  Ask your doctor if you may take off the collar for cleaning and bathing. If you may take off the collar: ? Follow instructions from your doctor about how to take off the collar safely. ? Clean the collar by wiping it with mild soap and water. Let it air-dry all the way. ? If your collar has removable pads:  Take the pads out every 1-2 days.  Hand wash the pads with soap and water.  Let the pads air-dry all the way before you put them back in the collar. Do not dry them in a clothes dryer. Do not dry them with a hair dryer. ? Check your skin under the collar for irritation or sores. If you see any, tell your doctor. Managing pain, stiffness, and swelling  Use a cervical traction device, if told by your doctor.  If told, put heat on the affected area. Do this before exercises (physical therapy) or as often as told by your doctor. Use the heat source that your doctor recommends, such as a moist heat pack or a heating pad. ? Place a towel between your skin and the heat source. ? Leave the heat on for 20-30 minutes. ? Take the heat off (remove the heat) if your skin  turns bright red. This is very important if you cannot feel pain, heat, or cold. You may have a greater risk of getting burned.  Put ice on the affected area. ? Put ice in a plastic bag. ? Place a towel between your skin and the bag. ? Leave the ice on for 20 minutes, 2-3 times a day. Activity  Do not drive while wearing a neck collar. If you do not have a neck collar, ask your doctor if it is safe to drive.  Do not drive or use heavy machinery while taking prescription pain medicine or muscle relaxants, unless your doctor approves.  Do not lift anything that is heavier than 10 lb (4.5 kg) until your doctor tells you that it is safe.  Rest as told by your doctor.  Avoid activities that make you feel worse. Ask your doctor what activities are safe for you.  Do exercises as told by your doctor or physical therapist. Preventing neck sprain  Practice good posture. Adjust your workstation to help with this, if needed.  Exercise regularly as told by your doctor or physical therapist.  Avoid activities that are risky or may cause a neck sprain (cervical sprain). General instructions  Take over-the-counter and prescription medicines only as told by your doctor.  Do not use any products that  contain nicotine or tobacco. This includes cigarettes and e-cigarettes. If you need help quitting, ask your doctor.  Keep all follow-up visits as told by your doctor. This is important. Contact a doctor if:  You have pain or other symptoms that get worse.  You have symptoms that do not get better after 2 weeks.  You have pain that does not get better with medicine.  You start to have new, unexplained symptoms.  You have sores or irritated skin from wearing your neck collar. Get help right away if:  You have very bad pain.  You have any of the following in any part of your body: ? Loss of feeling (numbness). ? Tingling. ? Weakness.  You cannot move a part of your body (you have  paralysis).  Your activity level does not improve. Summary  A cervical sprain is a stretch or tear in the tissues that connect bones (ligaments) in the neck.  If you have a neck (cervical) collar, do not take off the collar unless your doctor says that this is safe.  Put ice on affected areas as told by your doctor.  Put heat on affected areas as told by your doctor.  Good posture and regular exercise can help prevent a neck sprain from happening again. This information is not intended to replace advice given to you by your health care provider. Make sure you discuss any questions you have with your health care provider. Document Released: 03/03/2008 Document Revised: 05/27/2016 Document Reviewed: 05/27/2016 Elsevier Interactive Patient Education  2017 [employer].
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
## 2018-02-24 ENCOUNTER — Encounter: Payer: Self-pay | Admitting: Internal Medicine

## 2018-02-24 DIAGNOSIS — T148XXA Other injury of unspecified body region, initial encounter: Secondary | ICD-10-CM | POA: Insufficient documentation
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
## 2018-03-04 ENCOUNTER — Encounter: Payer: Self-pay | Admitting: Family Medicine

## 2018-03-04 ENCOUNTER — Ambulatory Visit (INDEPENDENT_AMBULATORY_CARE_PROVIDER_SITE_OTHER): Payer: No Typology Code available for payment source | Admitting: Family Medicine

## 2018-03-04 ENCOUNTER — Other Ambulatory Visit: Payer: Self-pay

## 2018-03-04 VITALS — BP 123/78 | HR 92 | Temp 98.2°F | Wt 140.0 lb

## 2018-03-04 DIAGNOSIS — T148XXA Other injury of unspecified body region, initial encounter: Secondary | ICD-10-CM | POA: Diagnosis not present

## 2018-03-04 MED ORDER — METAXALONE 800 MG PO TABS
800.0000 mg | ORAL_TABLET | Freq: Three times a day (TID) | ORAL | 2 refills | Status: DC
Start: 2018-03-04 — End: 2018-05-05

## 2018-03-04 MED ORDER — CAPSAICIN-MENTHOL-METHYL SAL 0.025-1-12 % EX CREA
1.0000 [IU] | TOPICAL_CREAM | Freq: Two times a day (BID) | CUTANEOUS | 3 refills | Status: DC
Start: 2018-03-04 — End: 2018-05-05

## 2018-03-04 MED ORDER — DICLOFENAC SODIUM 75 MG PO TBEC: 75.0000 mg | DELAYED_RELEASE_TABLET | Freq: Two times a day (BID) | ORAL | 0 refills | Status: DC
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
## 2018-03-29 ENCOUNTER — Encounter: Payer: Self-pay | Admitting: Family Medicine

## 2018-03-29 ENCOUNTER — Other Ambulatory Visit: Payer: Self-pay

## 2018-03-29 ENCOUNTER — Ambulatory Visit: Payer: No Typology Code available for payment source | Admitting: Family Medicine

## 2018-03-29 VITALS — BP 110/62 | HR 112 | Temp 98.0°F | Ht 68.0 in | Wt 138.2 lb

## 2018-03-29 DIAGNOSIS — R000 Tachycardia, unspecified: Secondary | ICD-10-CM

## 2018-03-29 DIAGNOSIS — M5431 Sciatica, right side: Secondary | ICD-10-CM

## 2018-03-29 MED ORDER — PREDNISONE 50 MG PO TABS: 50.0000 mg | ORAL_TABLET | Freq: Every day | ORAL | 0 refills | Status: DC
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
## 2018-03-30 ENCOUNTER — Other Ambulatory Visit: Payer: Self-pay

## 2018-03-30 DIAGNOSIS — M1990 Unspecified osteoarthritis, unspecified site: Secondary | ICD-10-CM

## 2018-03-30 MED ORDER — TRAMADOL HCL 50 MG PO TABS: 50.0000 mg | ORAL_TABLET | Freq: Four times a day (QID) | ORAL | 0 refills | Status: DC | PRN

## 2018-04-05 ENCOUNTER — Telehealth: Payer: Self-pay

## 2018-04-05 NOTE — Telephone Encounter (Signed)
Prior approval for Tramadol completed via CoverMyMeds. Med approved for 04/05/18 - 10/26/18.    [facility] pharmacy informed.  Danley Danker, RN [facility] RN)

## 2018-04-05 NOTE — Telephone Encounter (Signed)
Clinical questions submitted via CoverMyMeds. Status pending. Will recheck status next business day.  Danley Danker, RN [facility] RN)

## 2018-04-05 NOTE — Telephone Encounter (Signed)
Received fax from [facility] requesting prior authorization of Tramadol. Clinical questions forwarded to PCP via staff message. Danley Danker, RN [facility] RN)

## 2018-04-06 ENCOUNTER — Other Ambulatory Visit: Payer: Self-pay | Admitting: Family Medicine

## 2018-04-06 ENCOUNTER — Telehealth: Payer: Self-pay | Admitting: Family Medicine

## 2018-04-06 DIAGNOSIS — Z1231 Encounter for screening mammogram for malignant neoplasm of breast: Secondary | ICD-10-CM

## 2018-04-06 NOTE — Telephone Encounter (Signed)
Pt called and said when she was seen in June that Dr. Kennon Rounds filled out a form for her handicap sticker. She said in the past they were written to last for 5 years and this sticker shows it expires at the end of this year. Pt would like to know if Dr. Kennon Rounds could rewrite this letter for a longer time. She would like to have this form mailed to her.

## 2018-04-06 NOTE — Telephone Encounter (Signed)
Will forward to MD to advise. Lirio Bach,CMA  

## 2018-04-12 ENCOUNTER — Other Ambulatory Visit: Payer: Self-pay

## 2018-04-12 ENCOUNTER — Ambulatory Visit: Payer: No Typology Code available for payment source | Attending: Family Medicine | Admitting: Physical Therapy

## 2018-04-12 ENCOUNTER — Encounter: Payer: Self-pay | Admitting: Physical Therapy

## 2018-04-12 DIAGNOSIS — M5441 Lumbago with sciatica, right side: Secondary | ICD-10-CM | POA: Insufficient documentation

## 2018-04-12 DIAGNOSIS — M542 Cervicalgia: Secondary | ICD-10-CM | POA: Diagnosis present

## 2018-04-12 DIAGNOSIS — M6281 Muscle weakness (generalized): Secondary | ICD-10-CM | POA: Diagnosis present

## 2018-04-12 DIAGNOSIS — R293 Abnormal posture: Secondary | ICD-10-CM | POA: Diagnosis present

## 2018-04-12 DIAGNOSIS — M5412 Radiculopathy, cervical region: Secondary | ICD-10-CM | POA: Diagnosis present

## 2018-04-12 DIAGNOSIS — M5442 Lumbago with sciatica, left side: Secondary | ICD-10-CM | POA: Diagnosis not present
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
## 2018-04-19 ENCOUNTER — Ambulatory Visit: Payer: No Typology Code available for payment source | Admitting: Physical Therapy

## 2018-04-19 ENCOUNTER — Encounter: Payer: Self-pay | Admitting: Physical Therapy

## 2018-04-19 DIAGNOSIS — M5412 Radiculopathy, cervical region: Secondary | ICD-10-CM

## 2018-04-19 DIAGNOSIS — M6281 Muscle weakness (generalized): Secondary | ICD-10-CM

## 2018-04-19 DIAGNOSIS — R293 Abnormal posture: Secondary | ICD-10-CM

## 2018-04-19 DIAGNOSIS — M5442 Lumbago with sciatica, left side: Principal | ICD-10-CM

## 2018-04-19 DIAGNOSIS — M542 Cervicalgia: Secondary | ICD-10-CM

## 2018-04-19 DIAGNOSIS — M5441 Lumbago with sciatica, right side: Secondary | ICD-10-CM
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
## 2018-04-21 ENCOUNTER — Ambulatory Visit: Payer: No Typology Code available for payment source

## 2018-04-21 DIAGNOSIS — M542 Cervicalgia: Secondary | ICD-10-CM

## 2018-04-21 DIAGNOSIS — M5412 Radiculopathy, cervical region: Secondary | ICD-10-CM

## 2018-04-21 DIAGNOSIS — M6281 Muscle weakness (generalized): Secondary | ICD-10-CM

## 2018-04-21 DIAGNOSIS — M5442 Lumbago with sciatica, left side: Principal | ICD-10-CM

## 2018-04-21 DIAGNOSIS — R293 Abnormal posture: Secondary | ICD-10-CM

## 2018-04-21 DIAGNOSIS — M5441 Lumbago with sciatica, right side: Secondary | ICD-10-CM
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
## 2018-04-26 ENCOUNTER — Encounter: Payer: Self-pay | Admitting: Physical Therapy

## 2018-04-26 ENCOUNTER — Ambulatory Visit: Payer: No Typology Code available for payment source | Admitting: Physical Therapy

## 2018-04-26 DIAGNOSIS — M5442 Lumbago with sciatica, left side: Secondary | ICD-10-CM | POA: Diagnosis not present

## 2018-04-26 DIAGNOSIS — M542 Cervicalgia: Secondary | ICD-10-CM

## 2018-04-26 DIAGNOSIS — R293 Abnormal posture: Secondary | ICD-10-CM

## 2018-04-26 DIAGNOSIS — M6281 Muscle weakness (generalized): Secondary | ICD-10-CM

## 2018-04-26 DIAGNOSIS — M5412 Radiculopathy, cervical region: Secondary | ICD-10-CM

## 2018-04-26 DIAGNOSIS — M5441 Lumbago with sciatica, right side: Secondary | ICD-10-CM
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
## 2018-04-28 ENCOUNTER — Ambulatory Visit: Payer: No Typology Code available for payment source

## 2018-04-28 DIAGNOSIS — M5412 Radiculopathy, cervical region: Secondary | ICD-10-CM

## 2018-04-28 DIAGNOSIS — M5442 Lumbago with sciatica, left side: Secondary | ICD-10-CM | POA: Diagnosis not present

## 2018-04-28 DIAGNOSIS — M542 Cervicalgia: Secondary | ICD-10-CM

## 2018-04-28 DIAGNOSIS — M5441 Lumbago with sciatica, right side: Secondary | ICD-10-CM

## 2018-04-28 DIAGNOSIS — R293 Abnormal posture: Secondary | ICD-10-CM

## 2018-04-28 DIAGNOSIS — M6281 Muscle weakness (generalized): Secondary | ICD-10-CM

## 2018-04-28 NOTE — Patient Instructions (Signed)
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
## 2018-05-03 ENCOUNTER — Encounter: Payer: Self-pay | Admitting: Physical Therapy

## 2018-05-03 ENCOUNTER — Ambulatory Visit: Payer: No Typology Code available for payment source | Attending: Family Medicine | Admitting: Physical Therapy

## 2018-05-03 DIAGNOSIS — R293 Abnormal posture: Secondary | ICD-10-CM | POA: Insufficient documentation

## 2018-05-03 DIAGNOSIS — M5412 Radiculopathy, cervical region: Secondary | ICD-10-CM | POA: Diagnosis present

## 2018-05-03 DIAGNOSIS — M6281 Muscle weakness (generalized): Secondary | ICD-10-CM | POA: Insufficient documentation

## 2018-05-03 DIAGNOSIS — M5442 Lumbago with sciatica, left side: Secondary | ICD-10-CM | POA: Insufficient documentation

## 2018-05-03 DIAGNOSIS — M5441 Lumbago with sciatica, right side: Secondary | ICD-10-CM | POA: Diagnosis present

## 2018-05-03 DIAGNOSIS — M542 Cervicalgia: Secondary | ICD-10-CM | POA: Diagnosis present
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
## 2018-05-05 ENCOUNTER — Ambulatory Visit (INDEPENDENT_AMBULATORY_CARE_PROVIDER_SITE_OTHER): Payer: No Typology Code available for payment source | Admitting: Family Medicine

## 2018-05-05 ENCOUNTER — Encounter: Payer: Self-pay | Admitting: Family Medicine

## 2018-05-05 ENCOUNTER — Ambulatory Visit: Payer: No Typology Code available for payment source

## 2018-05-05 ENCOUNTER — Other Ambulatory Visit: Payer: Self-pay

## 2018-05-05 ENCOUNTER — Ambulatory Visit
Admission: RE | Admit: 2018-05-05 | Discharge: 2018-05-05 | Disposition: A | Discharge status: Home / Self Care | Payer: No Typology Code available for payment source | Source: Ambulatory Visit | Attending: Family Medicine | Admitting: Family Medicine

## 2018-05-05 VITALS — BP 124/78 | HR 69 | Temp 97.6°F | Ht 68.0 in | Wt 139.0 lb

## 2018-05-05 DIAGNOSIS — Z01419 Encounter for gynecological examination (general) (routine) without abnormal findings: Secondary | ICD-10-CM | POA: Diagnosis not present

## 2018-05-05 DIAGNOSIS — M5442 Lumbago with sciatica, left side: Secondary | ICD-10-CM | POA: Diagnosis not present

## 2018-05-05 DIAGNOSIS — I2510 Atherosclerotic heart disease of native coronary artery without angina pectoris: Secondary | ICD-10-CM | POA: Diagnosis not present

## 2018-05-05 DIAGNOSIS — L723 Sebaceous cyst: Secondary | ICD-10-CM | POA: Diagnosis not present

## 2018-05-05 DIAGNOSIS — M542 Cervicalgia: Secondary | ICD-10-CM

## 2018-05-05 DIAGNOSIS — T148XXA Other injury of unspecified body region, initial encounter: Secondary | ICD-10-CM

## 2018-05-05 DIAGNOSIS — M5412 Radiculopathy, cervical region: Secondary | ICD-10-CM

## 2018-05-05 DIAGNOSIS — R293 Abnormal posture: Secondary | ICD-10-CM

## 2018-05-05 DIAGNOSIS — M5441 Lumbago with sciatica, right side: Secondary | ICD-10-CM

## 2018-05-05 DIAGNOSIS — M6281 Muscle weakness (generalized): Secondary | ICD-10-CM

## 2018-05-05 DIAGNOSIS — Z1231 Encounter for screening mammogram for malignant neoplasm of breast: Secondary | ICD-10-CM

## 2018-05-05 NOTE — Patient Instructions (Signed)
Preventive Care 40-64 Years, Female Preventive care refers to lifestyle choices and visits with your health care provider that can promote health and wellness. What does preventive care include?  A yearly physical exam. This is also called an annual well check.  Dental exams once or twice a year.  Routine eye exams. Ask your health care provider how often you should have your eyes checked.  Personal lifestyle choices, including: ? Daily care of your teeth and gums. ? Regular physical activity. ? Eating a healthy diet. ? Avoiding tobacco and drug use. ? Limiting alcohol use. ? Practicing safe sex. ? Taking low-dose aspirin daily starting at age 58. ? Taking vitamin and mineral supplements as recommended by your health care provider. What happens during an annual well check? The services and screenings done by your health care provider during your annual well check will depend on your age, overall health, lifestyle risk factors, and family history of disease. Counseling Your health care provider may ask you questions about your:  Alcohol use.  Tobacco use.  Drug use.  Emotional well-being.  Home and relationship well-being.  Sexual activity.  Eating habits.  Work and work Statistician.  Method of birth control.  Menstrual cycle.  Pregnancy history.  Screening You may have the following tests or measurements:  Height, weight, and BMI.  Blood pressure.  Lipid and cholesterol levels. These may be checked every 5 years, or more frequently if you are over 81 years old.  Skin check.  Lung cancer screening. You may have this screening every year starting at age 78 if you have a 30-pack-year history of smoking and currently smoke or have quit within the past 15 years.  Fecal occult blood test (FOBT) of the stool. You may have this test every year starting at age 65.  Flexible sigmoidoscopy or colonoscopy. You may have a sigmoidoscopy every 5 years or a colonoscopy  every 10 years starting at age 30.  Hepatitis C blood test.  Hepatitis B blood test.  Sexually transmitted disease (STD) testing.  Diabetes screening. This is done by checking your blood sugar (glucose) after you have not eaten for a while (fasting). You may have this done every 1-3 years.  Mammogram. This may be done every 1-2 years. Talk to your health care provider about when you should start having regular mammograms. This may depend on whether you have a family history of breast cancer.  BRCA-related cancer screening. This may be done if you have a family history of breast, ovarian, tubal, or peritoneal cancers.  Pelvic exam and Pap test. This may be done every 3 years starting at age 80. Starting at age 36, this may be done every 5 years if you have a Pap test in combination with an HPV test.  Bone density scan. This is done to screen for osteoporosis. You may have this scan if you are at high risk for osteoporosis.  Discuss your test results, treatment options, and if necessary, the need for more tests with your health care provider. Vaccines Your health care provider may recommend certain vaccines, such as:  Influenza vaccine. This is recommended every year.  Tetanus, diphtheria, and acellular pertussis (Tdap, Td) vaccine. You may need a Td booster every 10 years.  Varicella vaccine. You may need this if you have not been vaccinated.  Zoster vaccine. You may need this after age 5.  Measles, mumps, and rubella (MMR) vaccine. You may need at least one dose of MMR if you were born in  1957 or later. You may also need a second dose.  Pneumococcal 13-valent conjugate (PCV13) vaccine. You may need this if you have certain conditions and were not previously vaccinated.  Pneumococcal polysaccharide (PPSV23) vaccine. You may need one or two doses if you smoke cigarettes or if you have certain conditions.  Meningococcal vaccine. You may need this if you have certain  conditions.  Hepatitis A vaccine. You may need this if you have certain conditions or if you travel or work in places where you may be exposed to hepatitis A.  Hepatitis B vaccine. You may need this if you have certain conditions or if you travel or work in places where you may be exposed to hepatitis B.  Haemophilus influenzae type b (Hib) vaccine. You may need this if you have certain conditions.  Talk to your health care provider about which screenings and vaccines you need and how often you need them. This information is not intended to replace advice given to you by your health care provider. Make sure you discuss any questions you have with your health care provider. Document Released: 10/12/2015 Document Revised: 06/04/2016 Document Reviewed: 07/17/2015 Elsevier Interactive Patient Education  2018 Elsevier Inc.  

## 2018-05-05 NOTE — Assessment & Plan Note (Signed)
Return for excision. 

## 2018-05-05 NOTE — Assessment & Plan Note (Signed)
Continue PT, tramadol and NSAIDS, heat, ice.
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
## 2018-05-05 NOTE — Assessment & Plan Note (Signed)
Continue atorvastatin and ASA

## 2018-05-06 ENCOUNTER — Encounter: Payer: Self-pay | Admitting: Family Medicine

## 2018-05-06 LAB — HEMOGLOBIN A1C
ESTIMATED AVERAGE GLUCOSE: 120 mg/dL
Hgb A1c MFr Bld: 5.8 % — ABNORMAL HIGH (ref 4.8–5.6)

## 2018-05-06 LAB — CBC
HEMOGLOBIN: 13.1 g/dL (ref 11.1–15.9)
Hematocrit: 39.3 % (ref 34.0–46.6)
MCH: 32.9 pg (ref 26.6–33.0)
MCHC: 33.3 g/dL (ref 31.5–35.7)
MCV: 99 fL — ABNORMAL HIGH (ref 79–97)
PLATELETS: 247 10*3/uL (ref 150–450)
RBC: 3.98 x10E6/uL (ref 3.77–5.28)
RDW: 13.2 % (ref 12.3–15.4)
WBC: 6.5 10*3/uL (ref 3.4–10.8)

## 2018-05-06 LAB — COMPREHENSIVE METABOLIC PANEL
ALK PHOS: 38 IU/L — AB (ref 39–117)
ALT: 21 IU/L (ref 0–32)
AST: 25 IU/L (ref 0–40)
Albumin/Globulin Ratio: 1.5 (ref 1.2–2.2)
Albumin: 4.3 g/dL (ref 3.6–4.8)
BUN/Creatinine Ratio: 16 (ref 12–28)
BUN: 14 mg/dL (ref 8–27)
CHLORIDE: 106 mmol/L (ref 96–106)
CO2: 24 mmol/L (ref 20–29)
Calcium: 9.3 mg/dL (ref 8.7–10.3)
Creatinine, Ser: 0.85 mg/dL (ref 0.57–1.00)
GFR calc Af Amer: 86 mL/min/{1.73_m2} (ref >59)
GFR calc non Af Amer: 75 mL/min/{1.73_m2} (ref >59)
GLUCOSE: 88 mg/dL (ref 65–99)
Globulin, Total: 2.8 g/dL (ref 1.5–4.5)
Potassium: 4.5 mmol/L (ref 3.5–5.2)
Sodium: 143 mmol/L (ref 134–144)
Total Protein: 7.1 g/dL (ref 6.0–8.5)

## 2018-05-06 LAB — TSH: TSH: 0.604 u[IU]/mL (ref 0.450–4.500)

## 2018-05-06 LAB — LIPID PANEL
CHOLESTEROL TOTAL: 155 mg/dL (ref 100–199)
Chol/HDL Ratio: 2.5 ratio (ref 0.0–4.4)
HDL: 61 mg/dL (ref >39)
LDL CALC: 81 mg/dL (ref 0–99)
TRIGLYCERIDES: 63 mg/dL (ref 0–149)
VLDL CHOLESTEROL CAL: 13 mg/dL (ref 5–40)

## 2018-05-06 LAB — VITAMIN D 25 HYDROXY (VIT D DEFICIENCY, FRACTURES): Vit D, 25-Hydroxy: 36.8 ng/mL (ref 30.0–100.0)

## 2018-05-10 ENCOUNTER — Ambulatory Visit: Payer: No Typology Code available for payment source | Admitting: Physical Therapy

## 2018-05-10 ENCOUNTER — Encounter: Payer: Self-pay | Admitting: Physical Therapy

## 2018-05-10 DIAGNOSIS — M5442 Lumbago with sciatica, left side: Principal | ICD-10-CM

## 2018-05-10 DIAGNOSIS — M542 Cervicalgia: Secondary | ICD-10-CM

## 2018-05-10 DIAGNOSIS — M5441 Lumbago with sciatica, right side: Secondary | ICD-10-CM

## 2018-05-10 DIAGNOSIS — M5412 Radiculopathy, cervical region: Secondary | ICD-10-CM

## 2018-05-10 DIAGNOSIS — M6281 Muscle weakness (generalized): Secondary | ICD-10-CM

## 2018-05-10 DIAGNOSIS — R293 Abnormal posture: Secondary | ICD-10-CM
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
## 2018-05-12 ENCOUNTER — Ambulatory Visit: Payer: No Typology Code available for payment source

## 2018-05-12 DIAGNOSIS — M5442 Lumbago with sciatica, left side: Principal | ICD-10-CM

## 2018-05-12 DIAGNOSIS — R293 Abnormal posture: Secondary | ICD-10-CM

## 2018-05-12 DIAGNOSIS — M5441 Lumbago with sciatica, right side: Secondary | ICD-10-CM

## 2018-05-12 DIAGNOSIS — M542 Cervicalgia: Secondary | ICD-10-CM

## 2018-05-12 DIAGNOSIS — M6281 Muscle weakness (generalized): Secondary | ICD-10-CM

## 2018-05-12 DIAGNOSIS — M5412 Radiculopathy, cervical region: Secondary | ICD-10-CM
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
## 2018-05-17 ENCOUNTER — Encounter: Payer: Self-pay | Admitting: Physical Therapy

## 2018-05-17 ENCOUNTER — Other Ambulatory Visit: Payer: Self-pay

## 2018-05-17 ENCOUNTER — Ambulatory Visit: Payer: No Typology Code available for payment source | Admitting: Physical Therapy

## 2018-05-17 DIAGNOSIS — M5442 Lumbago with sciatica, left side: Secondary | ICD-10-CM | POA: Diagnosis not present

## 2018-05-17 DIAGNOSIS — M5412 Radiculopathy, cervical region: Secondary | ICD-10-CM

## 2018-05-17 DIAGNOSIS — M1990 Unspecified osteoarthritis, unspecified site: Secondary | ICD-10-CM

## 2018-05-17 DIAGNOSIS — M6281 Muscle weakness (generalized): Secondary | ICD-10-CM

## 2018-05-17 DIAGNOSIS — M542 Cervicalgia: Secondary | ICD-10-CM

## 2018-05-17 DIAGNOSIS — R293 Abnormal posture: Secondary | ICD-10-CM

## 2018-05-17 DIAGNOSIS — M5441 Lumbago with sciatica, right side: Secondary | ICD-10-CM

## 2018-05-17 MED ORDER — TRAMADOL HCL 50 MG PO TABS
50.0000 mg | ORAL_TABLET | Freq: Four times a day (QID) | ORAL | 0 refills | Status: DC | PRN
Start: 2018-05-17 — End: 2018-06-24
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
## 2018-05-17 NOTE — Patient Instructions (Signed)
TENS stands for Transcutaneous Electrical Nerve Stimulation. In other words, electrical impulses are allowed to pass through the skin in order to excite a nerve.   Purpose and Use of TENS:  TENS is a method used to manage acute and chronic pain without the use of drugs. It has been effective in managing pain associated with surgery, sprains, strains, trauma, rheumatoid arthritis, and neuralgias. It is a non-addictive, low risk, and non-invasive technique used to control pain. It is not, by any means, a curative form of treatment.   How TENS Works:  Most TENS units are a Paramedic unit powered by one 9 volt battery. Attached to the outside of the unit are two lead wires where two pins and/or snaps connect on each wire. All units come with a set of four reusable pads or electrodes. These are placed on the skin surrounding the area involved. By inserting the leads into  the pads, the electricity can pass from the unit making the circuit complete.  As the intensity is turned up slowly, the electrical current enters the body from the electrodes through the skin to the surrounding nerve fibers. This triggers the release of hormones from within the body. These hormones contain pain relievers. By increasing the circulation of these hormones, the person's pain may be lessened. It is also believed that the electrical stimulation itself helps to block the pain messages being sent to the brain, thus also decreasing the body's perception of pain.   Hazards:  TENS units are NOT to be used by patients with PACEMAKERS, DEFIBRILLATORS, DIABETIC PUMPS, PREGNANT WOMEN, and patients with SEIZURE DISORDERS.  TENS units are NOT to be used over the heart, throat, brain, or spinal cord.  One of the major side effects from the TENS unit may be skin irritation. Some people may develop a rash if they are sensitive to the materials used in the electrodes or the connecting wires.   Wear the unit for 15-30 minutes at a  time.   Avoid overuse due the body getting used to the stem making it not as effective over time.

## 2018-05-19 ENCOUNTER — Ambulatory Visit: Payer: No Typology Code available for payment source

## 2018-05-19 DIAGNOSIS — M6281 Muscle weakness (generalized): Secondary | ICD-10-CM

## 2018-05-19 DIAGNOSIS — M5442 Lumbago with sciatica, left side: Principal | ICD-10-CM

## 2018-05-19 DIAGNOSIS — M5412 Radiculopathy, cervical region: Secondary | ICD-10-CM

## 2018-05-19 DIAGNOSIS — M542 Cervicalgia: Secondary | ICD-10-CM

## 2018-05-19 DIAGNOSIS — R293 Abnormal posture: Secondary | ICD-10-CM

## 2018-05-19 DIAGNOSIS — M5441 Lumbago with sciatica, right side: Secondary | ICD-10-CM
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
## 2018-05-24 ENCOUNTER — Encounter: Payer: Self-pay | Admitting: Physical Therapy

## 2018-05-24 ENCOUNTER — Ambulatory Visit: Payer: No Typology Code available for payment source | Admitting: Physical Therapy

## 2018-05-24 DIAGNOSIS — M5442 Lumbago with sciatica, left side: Principal | ICD-10-CM

## 2018-05-24 DIAGNOSIS — R293 Abnormal posture: Secondary | ICD-10-CM

## 2018-05-24 DIAGNOSIS — M542 Cervicalgia: Secondary | ICD-10-CM

## 2018-05-24 DIAGNOSIS — M6281 Muscle weakness (generalized): Secondary | ICD-10-CM

## 2018-05-24 DIAGNOSIS — M5412 Radiculopathy, cervical region: Secondary | ICD-10-CM

## 2018-05-24 DIAGNOSIS — M5441 Lumbago with sciatica, right side: Secondary | ICD-10-CM
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
## 2018-05-26 ENCOUNTER — Ambulatory Visit: Payer: No Typology Code available for payment source

## 2018-06-10 ENCOUNTER — Other Ambulatory Visit: Payer: Self-pay

## 2018-06-10 ENCOUNTER — Ambulatory Visit (INDEPENDENT_AMBULATORY_CARE_PROVIDER_SITE_OTHER): Payer: No Typology Code available for payment source | Admitting: Family Medicine

## 2018-06-10 ENCOUNTER — Encounter: Payer: Self-pay | Admitting: Family Medicine

## 2018-06-10 VITALS — BP 116/74 | HR 96 | Temp 98.1°F | Ht 68.0 in | Wt 142.0 lb

## 2018-06-10 DIAGNOSIS — Z23 Encounter for immunization: Secondary | ICD-10-CM

## 2018-06-10 DIAGNOSIS — M7989 Other specified soft tissue disorders: Secondary | ICD-10-CM | POA: Diagnosis not present

## 2018-06-10 DIAGNOSIS — L723 Sebaceous cyst: Secondary | ICD-10-CM | POA: Diagnosis not present

## 2018-06-10 DIAGNOSIS — T162XXA Foreign body in left ear, initial encounter: Secondary | ICD-10-CM

## 2018-06-10 NOTE — Patient Instructions (Signed)
Epidermal Cyst Removal, Care After Refer to this sheet in the next few weeks. These instructions provide you with information about caring for yourself after your procedure. Your health care provider may also give you more specific instructions. Your treatment has been planned according to current medical practices, but problems sometimes occur. Call your health care provider if you have any problems or questions after your procedure. What can I expect after the procedure? After the procedure, it is common to have:  Soreness in the area where your cyst was removed.  Tightness or itching from your skin sutures.  Follow these instructions at home:  Take medicines only as directed by your health care provider.  If you were prescribed an antibiotic medicine, finish all of it even if you start to feel better.  Use antibiotic ointment as directed by your health care provider. Follow the instructions carefully.  There are many different ways to close and cover an incision, including stitches (sutures), skin glue, and adhesive strips. Follow your health care provider's instructions about: ? Incision care. ? Bandage (dressing) changes and removal. ? Incision closure removal.  Keep the bandage (dressing) dry until your health care provider says that it can be removed. Take sponge baths only. Ask your health care provider when you can start showering or taking a bath.  After your dressing is off, check your incision every day for signs of infection. Watch for: ? Redness, swelling, or pain. ? Fluid, blood, or pus.  You can return to your normal activities. Do not do anything that stretches or puts pressure on your incision.  You can return to your normal diet.  Keep all follow-up visits as directed by your health care provider. This is important. Contact a health care provider if:  You have a fever.  Your incision bleeds.  You have redness, swelling, or pain in the incision area.  You  have fluid, blood, or pus coming from your incision.  Your cyst comes back after surgery. This information is not intended to replace advice given to you by your health care provider. Make sure you discuss any questions you have with your health care provider. Document Released: 10/06/2014 Document Revised: 02/21/2016 Document Reviewed: 05/31/2014 Elsevier Interactive Patient Education  2018 Elsevier Inc.  

## 2018-06-12 ENCOUNTER — Encounter: Payer: Self-pay | Admitting: Family Medicine
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
## 2018-06-24 ENCOUNTER — Other Ambulatory Visit: Payer: Self-pay | Admitting: Family Medicine

## 2018-06-24 DIAGNOSIS — M1990 Unspecified osteoarthritis, unspecified site: Secondary | ICD-10-CM

## 2018-07-05 ENCOUNTER — Telehealth: Payer: Self-pay

## 2018-07-05 DIAGNOSIS — I2510 Atherosclerotic heart disease of native coronary artery without angina pectoris: Secondary | ICD-10-CM

## 2018-07-05 NOTE — Telephone Encounter (Signed)
Pt called nurse line requesting her Lipitor be sent into [facility]. Her current pharmacy is asking her to pay 25 dollars. She is hoping [facility] will be cheaper. Will pend for provider with correct pharmacy.

## 2018-07-06 MED ORDER — ATORVASTATIN CALCIUM 80 MG PO TABS: ORAL_TABLET | ORAL | 0 refills | Status: AC

## 2018-08-20 ENCOUNTER — Other Ambulatory Visit: Payer: Self-pay

## 2018-08-20 DIAGNOSIS — M1990 Unspecified osteoarthritis, unspecified site: Secondary | ICD-10-CM

## 2018-08-23 MED ORDER — TRAMADOL HCL 50 MG PO TABS: 50.0000 mg | ORAL_TABLET | Freq: Four times a day (QID) | ORAL | 0 refills | Status: DC | PRN

## 2018-10-14 ENCOUNTER — Other Ambulatory Visit: Payer: Self-pay

## 2018-10-14 DIAGNOSIS — M1990 Unspecified osteoarthritis, unspecified site: Secondary | ICD-10-CM

## 2018-10-14 MED ORDER — TRAMADOL HCL 50 MG PO TABS: 50.0000 mg | ORAL_TABLET | Freq: Four times a day (QID) | ORAL | 0 refills | Status: DC | PRN

## 2018-10-27 ENCOUNTER — Other Ambulatory Visit: Payer: Self-pay | Admitting: Family Medicine

## 2018-10-27 ENCOUNTER — Encounter: Payer: Self-pay | Admitting: Family Medicine

## 2018-10-27 ENCOUNTER — Ambulatory Visit: Payer: No Typology Code available for payment source | Admitting: Family Medicine

## 2018-10-27 VITALS — BP 118/68 | HR 97 | Temp 98.2°F | Wt 141.0 lb

## 2018-10-27 DIAGNOSIS — A084 Viral intestinal infection, unspecified: Secondary | ICD-10-CM

## 2018-10-27 DIAGNOSIS — N6320 Unspecified lump in the left breast, unspecified quadrant: Secondary | ICD-10-CM

## 2018-10-27 DIAGNOSIS — N63 Unspecified lump in unspecified breast: Secondary | ICD-10-CM | POA: Diagnosis not present
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
## 2018-10-27 NOTE — Patient Instructions (Signed)
Breast Cyst  A breast cyst is a sac in the breast that is filled with fluid. Breast cysts are usually noncancerous (benign). They are common among women, and they are most often located in the upper, outer portion of the breast. One or more cysts may develop. They form when fluid builds up inside of the breast glands. There are several types of breast cysts:  Macrocyst. This is a cyst that is about 2 inches (5.1 cm) across (in diameter).  Microcyst. This is a very small cyst that you cannot feel, but it can be seen with imaging tests such as an X-ray of the breast (mammogram) or ultrasound.  Galactocele. This is a cyst that contains milk. It may develop if you suddenly stop breastfeeding. Breast cysts do not increase your risk of breast cancer. They usually disappear after menopause, unless you take artificial hormones (are on hormone therapy). What are the causes? The exact cause of breast cysts is not known. Possible causes include:  Blockage of tubes (ducts) in the breast glands, which leads to fluid buildup. Duct blockage may result from: ? Fibrocystic breast changes. This is a common, benign condition that occurs when women go through hormonal changes during the menstrual cycle. This is a common cause of multiple breast cysts. ? Overgrowth of breast tissue or breast glands. ? Scar tissue in the breast from previous surgery.  Changes in certain female hormones (estrogen and progesterone). What increases the risk? You may be more likely to develop breast cysts if you have not gone through menopause. What are the signs or symptoms? Symptoms of a breast cyst may include:  Feeling one or more smooth, round, soft lumps (like grapes) in the breast that are easily moveable. The lump(s) may get bigger and more painful before your period and get smaller after your period.  Breast discomfort or pain. How is this diagnosed? A cyst can be felt during a physical exam by your health care provider.  A mammogram and ultrasound will be done to confirm the diagnosis. Fluid may be removed from the cyst with a needle (fine-needle aspiration) and tested to make sure the cyst is not cancerous. How is this treated? Treatment may not be necessary. Your health care provider may monitor the cyst to see if it goes away on its own. If the cyst is uncomfortable or gets bigger, or if you do not like how the cyst makes your breast look, you may need treatment. Treatment may include:  Hormone treatment.  Fine-needle aspiration, to drain fluid from the cyst. There is a chance of the cyst coming back (recurring) after aspiration.  Surgery to remove the cyst. Follow these instructions at home:  See your health care provider regularly. ? Get a yearly physical exam. ? If you are 36-49 years old, get a clinical breast exam every 1-3 years. After age 47, get this exam every year. ? Get mammograms as often as directed.  Do a breast self-exam every month, or as often as directed. Having many breast cysts, or "lumpy" breasts, may make it harder to feel for new lumps. Understand how your breasts normally look and feel, and write down any changes in your breasts so you can tell your health care provider about the changes. A breast self-exam involves: ? Comparing your breasts in the mirror. ? Looking for visible changes in your skin or nipples. ? Feeling for lumps or changes.  Take over-the-counter and prescription medicines only as told by your health care provider.  Wear  the mirror.  ? Looking for visible changes in your skin or nipples.  ? Feeling for lumps or changes.  · Take over-the-counter and prescription medicines only as told by your health care provider.  · Wear a supportive bra, especially when exercising.  · Follow instructions from your health care provider about eating and drinking restrictions.  ? Avoid caffeine.  ? Cut down on salt (sodium) in what you eat and drink, especially before your menstrual period. Too much sodium can cause fluid buildup (retention), breast swelling, and discomfort.  · Keep all follow-up visits as told your health care provider. This is important.  Contact a health care  provider if:  · You feel, or think you feel, a lump in your breast.  · You notice that both breasts look or feel different than usual.  · Your breast is still causing pain after your menstrual period is over.  · You find new lumps or bumps that were not there before.  · You feel lumps in your armpit (axilla).  Get help right away if:  · You have severe pain, tenderness, redness, or warmth in your breast.  · You have fluid or blood leaking from your nipple.  · Your breast lump becomes hard and painful.  · You notice dimpling or wrinkling of the breast or nipple.  This information is not intended to replace advice given to you by your health care provider. Make sure you discuss any questions you have with your health care provider.  Document Released: 09/15/2005 Document Revised: 06/06/2016 Document Reviewed: 06/06/2016  Elsevier Interactive Patient Education © 2019 Elsevier Inc.
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
## 2018-11-08 ENCOUNTER — Other Ambulatory Visit: Payer: Self-pay | Admitting: Family Medicine

## 2018-11-08 ENCOUNTER — Ambulatory Visit
Admission: RE | Admit: 2018-11-08 | Discharge: 2018-11-08 | Disposition: A | Discharge status: Home / Self Care | Payer: No Typology Code available for payment source | Source: Ambulatory Visit | Attending: Family Medicine | Admitting: Family Medicine

## 2018-11-08 DIAGNOSIS — N63 Unspecified lump in unspecified breast: Secondary | ICD-10-CM

## 2018-11-08 DIAGNOSIS — N6320 Unspecified lump in the left breast, unspecified quadrant: Secondary | ICD-10-CM

## 2018-11-12 ENCOUNTER — Ambulatory Visit
Admission: RE | Admit: 2018-11-12 | Discharge: 2018-11-12 | Disposition: A | Discharge status: Home / Self Care | Payer: No Typology Code available for payment source | Source: Ambulatory Visit | Attending: Family Medicine | Admitting: Family Medicine

## 2018-11-12 DIAGNOSIS — N63 Unspecified lump in unspecified breast: Secondary | ICD-10-CM

## 2018-11-18 ENCOUNTER — Other Ambulatory Visit: Payer: Self-pay

## 2018-11-18 DIAGNOSIS — M1990 Unspecified osteoarthritis, unspecified site: Secondary | ICD-10-CM

## 2018-11-19 ENCOUNTER — Other Ambulatory Visit: Payer: Self-pay | Admitting: Surgery

## 2018-11-19 DIAGNOSIS — Z170 Estrogen receptor positive status [ER+]: Principal | ICD-10-CM

## 2018-11-19 DIAGNOSIS — C50912 Malignant neoplasm of unspecified site of left female breast: Secondary | ICD-10-CM

## 2018-11-19 MED ORDER — TRAMADOL HCL 50 MG PO TABS: 50.0000 mg | ORAL_TABLET | Freq: Four times a day (QID) | ORAL | 0 refills | Status: DC | PRN

## 2018-11-25 ENCOUNTER — Other Ambulatory Visit: Payer: Self-pay | Admitting: Surgery

## 2018-11-25 DIAGNOSIS — Z170 Estrogen receptor positive status [ER+]: Principal | ICD-10-CM

## 2018-11-25 DIAGNOSIS — C50912 Malignant neoplasm of unspecified site of left female breast: Secondary | ICD-10-CM

## 2018-12-02 DIAGNOSIS — C50412 Malignant neoplasm of upper-outer quadrant of left female breast: Secondary | ICD-10-CM | POA: Insufficient documentation

## 2018-12-02 DIAGNOSIS — Z170 Estrogen receptor positive status [ER+]: Secondary | ICD-10-CM | POA: Insufficient documentation

## 2019-01-01 ENCOUNTER — Other Ambulatory Visit: Payer: Self-pay | Admitting: Family Medicine

## 2019-01-01 DIAGNOSIS — M1990 Unspecified osteoarthritis, unspecified site: Secondary | ICD-10-CM

## 2019-01-17 ENCOUNTER — Other Ambulatory Visit: Payer: Self-pay

## 2019-01-17 ENCOUNTER — Encounter (HOSPITAL_BASED_OUTPATIENT_CLINIC_OR_DEPARTMENT_OTHER): Payer: Self-pay | Admitting: *Deleted

## 2019-01-17 ENCOUNTER — Ambulatory Visit
Admission: RE | Admit: 2019-01-17 | Discharge: 2019-01-17 | Disposition: A | Discharge status: Home / Self Care | Payer: PRIVATE HEALTH INSURANCE | Source: Ambulatory Visit | Attending: Surgery | Admitting: Surgery

## 2019-01-17 ENCOUNTER — Encounter (HOSPITAL_BASED_OUTPATIENT_CLINIC_OR_DEPARTMENT_OTHER)
Admission: RE | Admit: 2019-01-17 | Discharge: 2019-01-17 | Disposition: A | Discharge status: Home / Self Care | Payer: No Typology Code available for payment source | Source: Ambulatory Visit | Attending: Surgery | Admitting: Surgery

## 2019-01-17 DIAGNOSIS — M1990 Unspecified osteoarthritis, unspecified site: Secondary | ICD-10-CM | POA: Diagnosis not present

## 2019-01-17 DIAGNOSIS — Z170 Estrogen receptor positive status [ER+]: Secondary | ICD-10-CM | POA: Diagnosis not present

## 2019-01-17 DIAGNOSIS — Z79891 Long term (current) use of opiate analgesic: Secondary | ICD-10-CM | POA: Diagnosis not present

## 2019-01-17 DIAGNOSIS — I2510 Atherosclerotic heart disease of native coronary artery without angina pectoris: Secondary | ICD-10-CM | POA: Diagnosis not present

## 2019-01-17 DIAGNOSIS — F17200 Nicotine dependence, unspecified, uncomplicated: Secondary | ICD-10-CM | POA: Diagnosis not present

## 2019-01-17 DIAGNOSIS — Z7982 Long term (current) use of aspirin: Secondary | ICD-10-CM | POA: Diagnosis not present

## 2019-01-17 DIAGNOSIS — I252 Old myocardial infarction: Secondary | ICD-10-CM | POA: Diagnosis not present

## 2019-01-17 DIAGNOSIS — Z90721 Acquired absence of ovaries, unilateral: Secondary | ICD-10-CM | POA: Diagnosis not present

## 2019-01-17 DIAGNOSIS — C50412 Malignant neoplasm of upper-outer quadrant of left female breast: Secondary | ICD-10-CM | POA: Diagnosis present

## 2019-01-17 DIAGNOSIS — Z8619 Personal history of other infectious and parasitic diseases: Secondary | ICD-10-CM | POA: Diagnosis not present

## 2019-01-17 DIAGNOSIS — Z79899 Other long term (current) drug therapy: Secondary | ICD-10-CM | POA: Diagnosis not present

## 2019-01-17 DIAGNOSIS — C50912 Malignant neoplasm of unspecified site of left female breast: Secondary | ICD-10-CM

## 2019-01-17 LAB — COMPREHENSIVE METABOLIC PANEL
ALT: 23 U/L (ref 0–44)
AST: 21 U/L (ref 15–41)
Albumin: 3.9 g/dL (ref 3.5–5.0)
Alkaline Phosphatase: 34 U/L — ABNORMAL LOW (ref 38–126)
Anion gap: 7 (ref 5–15)
BUN: 14 mg/dL (ref 6–20)
CO2: 27 mmol/L (ref 22–32)
Calcium: 9.3 mg/dL (ref 8.9–10.3)
Chloride: 103 mmol/L (ref 98–111)
Creatinine, Ser: 0.87 mg/dL (ref 0.44–1.00)
GFR calc Af Amer: >60 mL/min (ref >60)
GFR calc non Af Amer: >60 mL/min (ref >60)
Glucose, Bld: 103 mg/dL — ABNORMAL HIGH (ref 70–99)
Potassium: 4.8 mmol/L (ref 3.5–5.1)
Sodium: 137 mmol/L (ref 135–145)
Total Bilirubin: 0.5 mg/dL (ref 0.3–1.2)
Total Protein: 7.2 g/dL (ref 6.5–8.1)

## 2019-01-17 NOTE — Progress Notes (Signed)
Ensure pre surgery drink given with instructions to complete by 0400 dos, surgical soap given with instructions, pt verbalized understanding.

## 2019-01-19 MED ORDER — CEFAZOLIN SODIUM-DEXTROSE 2-4 GM/100ML-% IV SOLN: 2.0000 g | INTRAVENOUS | Status: AC

## 2019-01-19 MED ORDER — SCOPOLAMINE 1 MG/3DAYS TD PT72: 1.0000 | MEDICATED_PATCH | Freq: Once | TRANSDERMAL | Status: DC | PRN

## 2019-01-19 MED ORDER — MIDAZOLAM HCL 2 MG/2ML IJ SOLN: 1.0000 mg | INTRAMUSCULAR | Status: DC | PRN

## 2019-01-19 MED ORDER — CHLORHEXIDINE GLUCONATE CLOTH 2 % EX PADS: 6.0000 | MEDICATED_PAD | Freq: Once | CUTANEOUS | Status: DC

## 2019-01-19 MED ORDER — FENTANYL CITRATE (PF) 100 MCG/2ML IJ SOLN: 50.0000 ug | INTRAMUSCULAR | Status: DC | PRN

## 2019-01-19 MED ORDER — GABAPENTIN 300 MG PO CAPS: 300.0000 mg | ORAL_CAPSULE | ORAL | Status: AC

## 2019-01-19 MED ORDER — ACETAMINOPHEN 500 MG PO TABS: 1000.0000 mg | ORAL_TABLET | ORAL | Status: AC

## 2019-01-19 MED ORDER — LACTATED RINGERS IV SOLN: INTRAVENOUS | Status: DC
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
## 2019-01-19 NOTE — Pre-Procedure Instructions (Signed)
LVM for pt in attempt to re screen for s/s of Covid-19. Instructions and arrival time left on VM with instructions to call MD and [phone] if she or or anyone at home is sick.

## 2019-01-20 ENCOUNTER — Encounter (HOSPITAL_BASED_OUTPATIENT_CLINIC_OR_DEPARTMENT_OTHER): Payer: Self-pay | Admitting: *Deleted

## 2019-01-20 ENCOUNTER — Encounter (HOSPITAL_COMMUNITY)
Admission: RE | Admit: 2019-01-20 | Discharge: 2019-01-20 | Disposition: A | Discharge status: Home / Self Care | Payer: No Typology Code available for payment source | Source: Ambulatory Visit | Attending: Surgery | Admitting: Surgery

## 2019-01-20 ENCOUNTER — Ambulatory Visit (HOSPITAL_BASED_OUTPATIENT_CLINIC_OR_DEPARTMENT_OTHER): Payer: No Typology Code available for payment source | Admitting: Anesthesiology

## 2019-01-20 ENCOUNTER — Ambulatory Visit (HOSPITAL_BASED_OUTPATIENT_CLINIC_OR_DEPARTMENT_OTHER)
Admission: RE | Admit: 2019-01-20 | Discharge: 2019-01-20 | Disposition: A | Discharge status: Home / Self Care | Payer: No Typology Code available for payment source | Attending: Surgery | Admitting: Surgery

## 2019-01-20 ENCOUNTER — Ambulatory Visit
Admission: RE | Admit: 2019-01-20 | Discharge: 2019-01-20 | Disposition: A | Discharge status: Home / Self Care | Payer: No Typology Code available for payment source | Source: Ambulatory Visit | Attending: Surgery | Admitting: Surgery

## 2019-01-20 ENCOUNTER — Encounter (HOSPITAL_BASED_OUTPATIENT_CLINIC_OR_DEPARTMENT_OTHER)
Admission: RE | Disposition: A | Discharge status: Home / Self Care | Payer: Self-pay | Source: Home / Self Care | Attending: Surgery

## 2019-01-20 DIAGNOSIS — Z7982 Long term (current) use of aspirin: Secondary | ICD-10-CM | POA: Insufficient documentation

## 2019-01-20 DIAGNOSIS — I2510 Atherosclerotic heart disease of native coronary artery without angina pectoris: Secondary | ICD-10-CM | POA: Insufficient documentation

## 2019-01-20 DIAGNOSIS — Z79891 Long term (current) use of opiate analgesic: Secondary | ICD-10-CM | POA: Insufficient documentation

## 2019-01-20 DIAGNOSIS — C50912 Malignant neoplasm of unspecified site of left female breast: Secondary | ICD-10-CM

## 2019-01-20 DIAGNOSIS — Z90721 Acquired absence of ovaries, unilateral: Secondary | ICD-10-CM | POA: Insufficient documentation

## 2019-01-20 DIAGNOSIS — Z79899 Other long term (current) drug therapy: Secondary | ICD-10-CM | POA: Insufficient documentation

## 2019-01-20 DIAGNOSIS — M1990 Unspecified osteoarthritis, unspecified site: Secondary | ICD-10-CM | POA: Insufficient documentation

## 2019-01-20 DIAGNOSIS — Z8619 Personal history of other infectious and parasitic diseases: Secondary | ICD-10-CM | POA: Insufficient documentation

## 2019-01-20 DIAGNOSIS — Z170 Estrogen receptor positive status [ER+]: Principal | ICD-10-CM

## 2019-01-20 DIAGNOSIS — F17200 Nicotine dependence, unspecified, uncomplicated: Secondary | ICD-10-CM | POA: Insufficient documentation

## 2019-01-20 DIAGNOSIS — C50412 Malignant neoplasm of upper-outer quadrant of left female breast: Secondary | ICD-10-CM | POA: Diagnosis not present

## 2019-01-20 DIAGNOSIS — I252 Old myocardial infarction: Secondary | ICD-10-CM | POA: Insufficient documentation

## 2019-01-20 HISTORY — PX: BREAST LUMPECTOMY WITH RADIOACTIVE SEED AND SENTINEL LYMPH NODE BIOPSY: SHX6550

## 2019-01-20 HISTORY — PX: BREAST LUMPECTOMY: SHX2

## 2019-01-20 SURGERY — BREAST LUMPECTOMY WITH RADIOACTIVE SEED AND SENTINEL LYMPH NODE BIOPSY
Anesthesia: General | Site: Breast | Laterality: Left

## 2019-01-20 MED ORDER — FENTANYL CITRATE (PF) 100 MCG/2ML IJ SOLN
INTRAMUSCULAR | Status: AC
  Filled 2019-01-20: qty 2

## 2019-01-20 MED ORDER — BUPIVACAINE HCL (PF) 0.25 % IJ SOLN
INTRAMUSCULAR | Status: AC
  Filled 2019-01-20: qty 30

## 2019-01-20 MED ORDER — TECHNETIUM TC 99M SULFUR COLLOID FILTERED
1.0000 | Freq: Once | INTRAVENOUS | Status: AC | PRN
  Administered 2019-01-20: 1 via INTRADERMAL

## 2019-01-20 MED ORDER — OXYCODONE HCL 5 MG PO TABS: 5.0000 mg | ORAL_TABLET | Freq: Once | ORAL | Status: DC | PRN

## 2019-01-20 MED ORDER — CEFAZOLIN SODIUM-DEXTROSE 2-4 GM/100ML-% IV SOLN
2.0000 g | INTRAVENOUS | Status: AC
  Administered 2019-01-20: 08:00:00 2 g via INTRAVENOUS

## 2019-01-20 MED ORDER — LACTATED RINGERS IV SOLN
INTRAVENOUS | Status: DC
  Administered 2019-01-20: 07:00:00 via INTRAVENOUS

## 2019-01-20 MED ORDER — HYDROMORPHONE HCL 1 MG/ML IJ SOLN: 0.2500 mg | INTRAMUSCULAR | Status: DC | PRN

## 2019-01-20 MED ORDER — HEPARIN SOD (PORK) LOCK FLUSH 100 UNIT/ML IV SOLN
INTRAVENOUS | Status: AC
  Filled 2019-01-20: qty 5

## 2019-01-20 MED ORDER — ONDANSETRON HCL 4 MG/2ML IJ SOLN
INTRAMUSCULAR | Status: AC
  Filled 2019-01-20: qty 2

## 2019-01-20 MED ORDER — PHENYLEPHRINE 40 MCG/ML (10ML) SYRINGE FOR IV PUSH (FOR BLOOD PRESSURE SUPPORT)
PREFILLED_SYRINGE | INTRAVENOUS | Status: AC
  Filled 2019-01-20: qty 10

## 2019-01-20 MED ORDER — BUPIVACAINE-EPINEPHRINE (PF) 0.5% -1:200000 IJ SOLN
INTRAMUSCULAR | Status: AC
  Filled 2019-01-20: qty 30

## 2019-01-20 MED ORDER — GABAPENTIN 300 MG PO CAPS
ORAL_CAPSULE | ORAL | Status: AC
  Filled 2019-01-20: qty 1

## 2019-01-20 MED ORDER — FENTANYL CITRATE (PF) 100 MCG/2ML IJ SOLN
50.0000 ug | INTRAMUSCULAR | Status: DC | PRN
  Administered 2019-01-20: 08:00:00 100 ug via INTRAVENOUS
  Administered 2019-01-20: 09:00:00 25 ug via INTRAVENOUS

## 2019-01-20 MED ORDER — TRAMADOL HCL 50 MG PO TABS: 50.0000 mg | ORAL_TABLET | Freq: Four times a day (QID) | ORAL | 1 refills | Status: DC | PRN

## 2019-01-20 MED ORDER — 0.9 % SODIUM CHLORIDE (POUR BTL) OPTIME
TOPICAL | Status: DC | PRN
  Administered 2019-01-20: 400 mL

## 2019-01-20 MED ORDER — PROPOFOL 10 MG/ML IV BOLUS
INTRAVENOUS | Status: AC
  Filled 2019-01-20: qty 40

## 2019-01-20 MED ORDER — ONDANSETRON HCL 4 MG/2ML IJ SOLN
INTRAMUSCULAR | Status: DC | PRN
  Administered 2019-01-20: 4 mg via INTRAVENOUS

## 2019-01-20 MED ORDER — FENTANYL CITRATE (PF) 100 MCG/2ML IJ SOLN
INTRAMUSCULAR | Status: AC
  Filled 2019-01-20: qty 4

## 2019-01-20 MED ORDER — DEXAMETHASONE SODIUM PHOSPHATE 10 MG/ML IJ SOLN
INTRAMUSCULAR | Status: DC | PRN
  Administered 2019-01-20: 5 mg via INTRAVENOUS

## 2019-01-20 MED ORDER — LIDOCAINE 2% (20 MG/ML) 5 ML SYRINGE
INTRAMUSCULAR | Status: DC | PRN
  Administered 2019-01-20: 50 mg via INTRAVENOUS

## 2019-01-20 MED ORDER — PHENYLEPHRINE HCL (PRESSORS) 10 MG/ML IV SOLN
INTRAVENOUS | Status: DC | PRN
  Administered 2019-01-20 (×4): 80 ug via INTRAVENOUS

## 2019-01-20 MED ORDER — CHLORHEXIDINE GLUCONATE CLOTH 2 % EX PADS: 6.0000 | MEDICATED_PAD | Freq: Once | CUTANEOUS | Status: DC

## 2019-01-20 MED ORDER — MIDAZOLAM HCL 2 MG/2ML IJ SOLN
INTRAMUSCULAR | Status: AC
  Filled 2019-01-20: qty 2

## 2019-01-20 MED ORDER — MEPERIDINE HCL 25 MG/ML IJ SOLN: 6.2500 mg | INTRAMUSCULAR | Status: DC | PRN

## 2019-01-20 MED ORDER — SCOPOLAMINE 1 MG/3DAYS TD PT72
1.0000 | MEDICATED_PATCH | Freq: Once | TRANSDERMAL | Status: AC | PRN
  Administered 2019-01-20: 1 via TRANSDERMAL

## 2019-01-20 MED ORDER — OXYCODONE HCL 5 MG/5ML PO SOLN: 5.0000 mg | Freq: Once | ORAL | Status: DC | PRN

## 2019-01-20 MED ORDER — BUPIVACAINE HCL (PF) 0.5 % IJ SOLN
INTRAMUSCULAR | Status: AC
  Filled 2019-01-20: qty 30

## 2019-01-20 MED ORDER — GABAPENTIN 300 MG PO CAPS: 300.0000 mg | ORAL_CAPSULE | ORAL | Status: DC

## 2019-01-20 MED ORDER — BUPIVACAINE-EPINEPHRINE (PF) 0.25% -1:200000 IJ SOLN
INTRAMUSCULAR | Status: DC | PRN
  Administered 2019-01-20: 20 mL

## 2019-01-20 MED ORDER — SCOPOLAMINE 1 MG/3DAYS TD PT72
MEDICATED_PATCH | TRANSDERMAL | Status: AC
  Filled 2019-01-20: qty 1

## 2019-01-20 MED ORDER — METHYLENE BLUE 0.5 % INJ SOLN
INTRAVENOUS | Status: AC
  Filled 2019-01-20: qty 10

## 2019-01-20 MED ORDER — BUPIVACAINE-EPINEPHRINE (PF) 0.25% -1:200000 IJ SOLN
INTRAMUSCULAR | Status: AC
  Filled 2019-01-20: qty 30

## 2019-01-20 MED ORDER — SODIUM CHLORIDE (PF) 0.9 % IJ SOLN
INTRAMUSCULAR | Status: AC
  Filled 2019-01-20: qty 10

## 2019-01-20 MED ORDER — PROMETHAZINE HCL 25 MG/ML IJ SOLN: 6.2500 mg | INTRAMUSCULAR | Status: DC | PRN

## 2019-01-20 MED ORDER — CEFAZOLIN SODIUM-DEXTROSE 2-4 GM/100ML-% IV SOLN
INTRAVENOUS | Status: AC
  Filled 2019-01-20: qty 100

## 2019-01-20 MED ORDER — PROPOFOL 10 MG/ML IV BOLUS
INTRAVENOUS | Status: DC | PRN
  Administered 2019-01-20: 100 mg via INTRAVENOUS

## 2019-01-20 MED ORDER — KETOROLAC TROMETHAMINE 30 MG/ML IJ SOLN
INTRAMUSCULAR | Status: AC
  Filled 2019-01-20: qty 1

## 2019-01-20 MED ORDER — ACETAMINOPHEN 500 MG PO TABS
1000.0000 mg | ORAL_TABLET | ORAL | Status: AC
  Administered 2019-01-20: 1000 mg via ORAL

## 2019-01-20 MED ORDER — HEPARIN (PORCINE) IN NACL 1000-0.9 UT/500ML-% IV SOLN
INTRAVENOUS | Status: AC
  Filled 2019-01-20: qty 500

## 2019-01-20 MED ORDER — MIDAZOLAM HCL 2 MG/2ML IJ SOLN
1.0000 mg | INTRAMUSCULAR | Status: DC | PRN
  Administered 2019-01-20 (×2): 2 mg via INTRAVENOUS

## 2019-01-20 MED ORDER — ROPIVACAINE HCL 5 MG/ML IJ SOLN
INTRAMUSCULAR | Status: DC | PRN
  Administered 2019-01-20: 30 mL via PERINEURAL

## 2019-01-20 MED ORDER — DEXAMETHASONE SODIUM PHOSPHATE 10 MG/ML IJ SOLN
INTRAMUSCULAR | Status: AC
  Filled 2019-01-20: qty 1

## 2019-01-20 MED ORDER — ACETAMINOPHEN 500 MG PO TABS
ORAL_TABLET | ORAL | Status: AC
  Filled 2019-01-20: qty 2

## 2019-01-20 SURGICAL SUPPLY — 59 items
ADH SKN CLS APL DERMABOND .7 (GAUZE/BANDAGES/DRESSINGS) ×1
APL PRP STRL LF DISP 70% ISPRP (MISCELLANEOUS) ×1
APL SKNCLS STERI-STRIP NONHPOA (GAUZE/BANDAGES/DRESSINGS)
BENZOIN TINCTURE PRP APPL 2/3 (GAUZE/BANDAGES/DRESSINGS) IMPLANT
BINDER BREAST LRG (GAUZE/BANDAGES/DRESSINGS) IMPLANT
BINDER BREAST MEDIUM (GAUZE/BANDAGES/DRESSINGS) ×2 IMPLANT
BINDER BREAST XLRG (GAUZE/BANDAGES/DRESSINGS) IMPLANT
BINDER BREAST XXLRG (GAUZE/BANDAGES/DRESSINGS) IMPLANT
BLADE SURG 15 STRL LF DISP TIS (BLADE) ×1 IMPLANT
BLADE SURG 15 STRL SS (BLADE) ×6
CANISTER SUC SOCK COL 7IN (MISCELLANEOUS) IMPLANT
CANISTER SUCT 1200ML W/VALVE (MISCELLANEOUS) ×3 IMPLANT
CHLORAPREP W/TINT 26 (MISCELLANEOUS) ×3 IMPLANT
CLIP VESOCCLUDE SM WIDE 6/CT (CLIP) ×3 IMPLANT
CLOSURE WOUND 1/2 X4 (GAUZE/BANDAGES/DRESSINGS)
COVER BACK TABLE REUSABLE LG (DRAPES) ×3 IMPLANT
COVER MAYO STAND REUSABLE (DRAPES) ×3 IMPLANT
COVER PROBE W GEL 5X96 (DRAPES) ×3 IMPLANT
COVER WAND RF STERILE (DRAPES) ×2 IMPLANT
DECANTER SPIKE VIAL GLASS SM (MISCELLANEOUS) IMPLANT
DERMABOND ADVANCED (GAUZE/BANDAGES/DRESSINGS) ×2
DERMABOND ADVANCED .7 DNX12 (GAUZE/BANDAGES/DRESSINGS) ×1 IMPLANT
DRAPE LAPAROSCOPIC ABDOMINAL (DRAPES) ×3 IMPLANT
DRAPE UTILITY XL STRL (DRAPES) ×3 IMPLANT
DRSG PAD ABDOMINAL 8X10 ST (GAUZE/BANDAGES/DRESSINGS) ×2 IMPLANT
ELECT COATED BLADE 2.86 ST (ELECTRODE) ×3 IMPLANT
ELECT REM PT RETURN 9FT ADLT (ELECTROSURGICAL) ×3
ELECTRODE REM PT RTRN 9FT ADLT (ELECTROSURGICAL) ×1 IMPLANT
GAUZE SPONGE 4X4 12PLY STRL (GAUZE/BANDAGES/DRESSINGS) ×3 IMPLANT
GLOVE BIOGEL PI IND STRL 6.5 (GLOVE) IMPLANT
GLOVE BIOGEL PI IND STRL 7.5 (GLOVE) IMPLANT
GLOVE BIOGEL PI INDICATOR 6.5 (GLOVE) ×2
GLOVE BIOGEL PI INDICATOR 7.5 (GLOVE) ×2
GLOVE SURG SIGNA 7.5 PF LTX (GLOVE) ×6 IMPLANT
GLOVE SURG SS PI 6.5 STRL IVOR (GLOVE) ×2 IMPLANT
GOWN STRL REUS W/ TWL LRG LVL3 (GOWN DISPOSABLE) ×1 IMPLANT
GOWN STRL REUS W/ TWL XL LVL3 (GOWN DISPOSABLE) ×1 IMPLANT
GOWN STRL REUS W/TWL LRG LVL3 (GOWN DISPOSABLE) ×3
GOWN STRL REUS W/TWL XL LVL3 (GOWN DISPOSABLE) ×3
KIT MARKER MARGIN INK (KITS) ×3 IMPLANT
NDL HYPO 25X1 1.5 SAFETY (NEEDLE) ×1 IMPLANT
NDL SAFETY ECLIPSE 18X1.5 (NEEDLE) IMPLANT
NEEDLE HYPO 18GX1.5 SHARP (NEEDLE)
NEEDLE HYPO 25X1 1.5 SAFETY (NEEDLE) ×3 IMPLANT
NS IRRIG 1000ML POUR BTL (IV SOLUTION) ×3 IMPLANT
PACK BASIN DAY SURGERY FS (CUSTOM PROCEDURE TRAY) ×3 IMPLANT
PENCIL BUTTON HOLSTER BLD 10FT (ELECTRODE) ×3 IMPLANT
SHEET MEDIUM DRAPE 40X70 STRL (DRAPES) ×3 IMPLANT
SLEEVE SCD COMPRESS KNEE MED (MISCELLANEOUS) ×3 IMPLANT
SPONGE LAP 18X18 RF (DISPOSABLE) ×3 IMPLANT
STRIP CLOSURE SKIN 1/2X4 (GAUZE/BANDAGES/DRESSINGS) IMPLANT
SUT MNCRL AB 4-0 PS2 18 (SUTURE) ×3 IMPLANT
SUT VICRYL 3-0 CR8 SH (SUTURE) ×3 IMPLANT
SYR CONTROL 10ML LL (SYRINGE) ×3 IMPLANT
TOWEL GREEN STERILE FF (TOWEL DISPOSABLE) ×3 IMPLANT
TRAY FAXITRON CT DISP (TRAY / TRAY PROCEDURE) ×3 IMPLANT
TUBE CONNECTING 20'X1/4 (TUBING) ×1
TUBE CONNECTING 20X1/4 (TUBING) ×2 IMPLANT
YANKAUER SUCT BULB TIP NO VENT (SUCTIONS) ×3 IMPLANT

## 2019-01-20 NOTE — Anesthesia Preprocedure Evaluation (Addendum)
Anesthesia Evaluation  Patient identified by MRN, date of birth, ID band Patient awake    Reviewed: Allergy & Precautions, NPO status , Patient's Chart, lab work & pertinent test results  Airway Mallampati: II  TM Distance: >3 FB Neck ROM: Full    Dental no notable dental hx. (+) Dental Advisory Given, Caps, Teeth Intact,    Pulmonary neg pulmonary ROS, Current Smoker,    Pulmonary exam normal breath sounds clear to auscultation       Cardiovascular + CAD and + Past MI  Normal cardiovascular exam Rhythm:Regular Rate:Normal     Neuro/Psych  Headaches, negative psych ROS   GI/Hepatic negative GI ROS, Neg liver ROS,   Endo/Other  negative endocrine ROS  Renal/GU negative Renal ROS  negative genitourinary   Musculoskeletal  (+) Arthritis , Osteoarthritis,    Abdominal   Peds negative pediatric ROS (+)  Hematology negative hematology ROS (+)   Anesthesia Other Findings 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  Reproductive/Obstetrics negative OB ROS                          Anesthesia Physical Anesthesia Plan  ASA: III  Anesthesia Plan: General   Post-op Pain Management:  Regional for Post-op pain   Induction: Intravenous  PONV Risk Score and Plan: 2 and Ondansetron and Midazolam  Airway Management Planned: LMA  Additional Equipment:   Intra-op Plan:   Post-operative Plan: Extubation in OR  Informed Consent: I have reviewed the patients History and Physical, chart, labs and discussed the procedure including the risks, benefits and alternatives for the proposed anesthesia with the patient or authorized representative who has indicated his/her understanding and acceptance.     Dental advisory given  Plan Discussed with: CRNA  Anesthesia Plan Comments:         Anesthesia Quick Evaluation

## 2019-01-20 NOTE — Progress Notes (Signed)
Assisted Dr. Miller with left, ultrasound guided, popliteal block. Side rails up, monitors on throughout procedure. See vital signs in flow sheet. Tolerated Procedure well. 

## 2019-01-20 NOTE — Discharge Instructions (Signed)
[facility] - DISCHARGE INSTRUCTIONS TO PATIENT  Activity:  Driving - May drive in 2 to 4 days, when off pain meds   Lifting - No lifting more than 15 pounds for 7 days, then no limit  Wound Care:   Leave bandage for 2 days, then remove bandage and shower  Diet:  As tolerated  Follow up appointment:  Call Dr. Pollie Friar office [facility]) at [phone] for an appointment in 2-3 weeks.         We are doing "e" visits post op during this Covid-19 virus epidemic, our office will contact you about this arrangement.  If you have not heard from our office, call our office the day before your scheduled visit to make plans for your visit.  Medications and dosages:  Resume your home medications.  You have a prescription for:  Ultram  Call Dr. Lucia Gaskins or his office  [phone]) if you have:  Temperature greater than 100.4,  Persistent nausea and vomiting,  Severe uncontrolled pain,  Redness, tenderness, or signs of infection (pain, swelling, redness, odor or green/yellow discharge around the site),  Difficulty breathing, headache or visual disturbances,  Any other questions or concerns you may have after discharge.  In an emergency, call 911 or go to an Emergency Department at a nearby hospital.   Post Anesthesia Home Care Instructions  Activity: Get plenty of rest for the remainder of the day. A responsible individual must stay with you for 24 hours following the procedure.  For the next 24 hours, DO NOT: -Drive a car -Paediatric nurse -Drink alcoholic beverages -Take any medication unless instructed by your physician -Make any legal decisions or sign important papers.  Meals: Start with liquid foods such as gelatin or soup. Progress to regular foods as tolerated. Avoid greasy, spicy, heavy foods. If nausea and/or vomiting occur, drink only clear liquids until the nausea and/or vomiting subsides. Call your physician if vomiting continues.  Special  Instructions/Symptoms: Your throat may feel dry or sore from the anesthesia or the breathing tube placed in your throat during surgery. If this causes discomfort, gargle with warm salt water. The discomfort should disappear within 24 hours.  If you had a scopolamine patch placed behind your ear for the management of post- operative nausea and/or vomiting:  1. The medication in the patch is effective for 72 hours, after which it should be removed.  Wrap patch in a tissue and discard in the trash. Wash hands thoroughly with soap and water. 2. You may remove the patch earlier than 72 hours if you experience unpleasant side effects which may include dry mouth, dizziness or visual disturbances. 3. Avoid touching the patch. Wash your hands with soap and water after contact with the patch.    Regional Anesthesia Blocks  Numbness or the inability to move the "blocked" extremity may last from 3-48 hours after placement. The length of time depends on the medication injected and your individual response to the medication. If the numbness is not going away after 48 hours, call your surgeon.  Bruising and tenderness at the needle site are common side effects and will resolve in a few days.  Persistent numbness or new problems with movement should be communicated to the surgeon or the [place] [facility] [phone] [place] [phone]).

## 2019-01-20 NOTE — Anesthesia Procedure Notes (Signed)
Anesthesia Regional Block: Pectoralis block   Pre-Anesthetic Checklist: ,, timeout performed, Correct Patient, Correct Site, Correct Laterality, Correct Procedure, Correct Position, site marked, Risks and benefits discussed,  Surgical consent,  Pre-op evaluation,  At surgeon's request and post-op pain management  Laterality: Left  Prep: chloraprep       Needles:  Injection technique: Single-shot  Needle Type: Stimiplex     Needle Length: 9cm  Needle Gauge: 21     Additional Needles:   Procedures:,,,, ultrasound used (permanent image in chart),,,,  Narrative:  Start time: 01/20/2019 7:32 AM End time: 01/20/2019 7:37 AM Injection made incrementally with aspirations every 5 mL.  Performed by: Personally  Anesthesiologist: Lynda Rainwater, MD

## 2019-01-20 NOTE — Transfer of Care (Signed)
Immediate Anesthesia Transfer of Care Note  Patient: Taylor Walker  Procedure(s) Performed: LEFT BREAST LUMPECTOMY WITH RADIOACTIVE SEED AND LEFT AXILLARY SENTINEL LYMPH NODE BIOPSY (Left Breast)  Patient Location: PACU  Anesthesia Type:General  Level of Consciousness: awake, alert , oriented and patient cooperative  Airway & Oxygen Therapy: Patient Spontanous Breathing and Patient connected to nasal cannula oxygen  Post-op Assessment: Report given to RN and Post -op Vital signs reviewed and stable  Post vital signs: Reviewed and stable  Last Vitals:  Vitals Value Taken Time  BP    Temp    Pulse 88 01/20/2019  9:14 AM  Resp 16 01/20/2019  9:14 AM  SpO2 100 % 01/20/2019  9:14 AM  Vitals shown include unvalidated device data.  Last Pain:  Vitals:   01/20/19 0704  TempSrc: Oral  PainSc: 0-No pain         Complications: No apparent anesthesia complications

## 2019-01-20 NOTE — Op Note (Addendum)
01/20/2019  9:14 AM  PATIENT:  Taylor Walker DOB: [birth year] MRN: [id number]  PREOP DIAGNOSIS:   LEFT BREAST CANCER  POSTOP DIAGNOSIS:    Left breast cancer, 1:30 o'clock position (T1, N0)  PROCEDURE:   Procedure(s): LEFT BREAST LUMPECTOMY WITH RADIOACTIVE SEED AND LEFT AXILLARY SENTINEL LYMPH NODE BIOPSY, deep sentinel lymph node biopsy (Single incision)  SURGEON:   Alphonsa Overall, M.D.  ANESTHESIA:   General  Anesthesiologist: Lynda Rainwater, MD CRNA: Wanita Chamberlain, CRNA  General  EBL:  50  ml  DRAINS:  none   LOCAL MEDICATIONS USED:   20 cc 1/4% marcaine, left pectoral block by anesthesia  SPECIMEN:   Left breast lumpectomy (6 color paint), medial margin of left breast lumpectomy, left axillary sentinel lymph node (counts 300, background 5)  COUNTS CORRECT:  YES  INDICATIONS FOR PROCEDURE:  Taylor Walker is a 63 y.o. (DOB: [birth year]) AA female whose primary care physician is [name], MD and comes for left breast lumpectomy and left axillary sentinel lymph node biopsy.   She has been seen by Drs. Humphrey Rolls and [place] in [place] with me.  The options for breast cancer treatment have been discussed with the patient. She elected to proceed with lumpectomy and axillary sentinel lymph node.     The indications and potential complications of surgery were explained to the patient. Potential complications include, but are not limited to, bleeding, infection, the need for further surgery, and nerve injury.     She had a I131 seed placed on 01/17/2019 in her left breast at [facility].  The seed is in the 1:30 o'clock position of the left breast.   In the holding area, her left areola was injected with 1 millicurie of Technitium Sulfur Colloid.  OPERATIVE NOTE:   The patient was taken to operating room # 6 at [facility] where she underwent a general anesthesia  supervised by Anesthesiologist: Lynda Rainwater, MD CRNA: Wanita Chamberlain, CRNA. Her left breast  and axilla were prepped with  ChloraPrep and sterilely draped.    A time-out and the surgical check list was reviewed.    I decided to make a single incision to do the lumpectomy and left axillary sentinel node biopsy.  So I made a left axillary based incision.  I dissected to the cancer which was about at the 1:30 o'clock position of the left breast.   I used the Neoprobe to identify the I131 seed.  I tried to excise an area around the tumor of at least 1 cm.    I excised this block of breast tissue approximately 3 cm by 4 cm  in diameter.  I took the dissection down to the pectoralis major.  I painted the lumpectomy specimen with the 6 color paint kit and did a specimen mammogram which confirmed the mass, clip, and the seed were all in the right position in the specimen.  The specimen was sent to pathology who called back to confirm that they have the seed and the specimen.   Because the tumor appeared closed to the medial margin, I took an additional medial margin that I painted for orientation.   I then started the left deep axillary sentinel lymph node biopsy. I went through the same incision that I had done the lumpectomy.  I found a hot area at the junction of the breast and the pectoralis major muscle, deep in the axilla. I cut down and  identified a hot node that  had counts of 300 and the background has 5 counts.  I checked her internal mammary nodes and supraclavicular nodes with the neoprobe and found no other hot area. The axillary node was then sent to pathology.    I then irrigated the wound with saline. I infiltrated approximately 20 mL of 1/4% Marcaine between the incisions. I placed 4 clips to mark breast biopsy cavity, at 12, 3, 6, and 9 o'clock.  I then closed the wound in layers using 3-0 Vicryl sutures for the deep layer. At the skin, I closed the incisions with a 4-0 Monocryl suture. The incision was then painted with Dermabond.  She had gauze place over the wound and placed in a  breast binder.   The patient tolerated the procedure well, was transported to the recovery room in good condition. Sponge and needle count were correct at the end of the case.   Final pathology is pending.   Left breast lumpectomy specimen   Alphonsa Overall, MD, [facility] Surgery Pager: [phone] Office phone:  [phone]

## 2019-01-20 NOTE — Anesthesia Postprocedure Evaluation (Signed)
Anesthesia Post Note  Patient: Forensic psychologist  Procedure(s) Performed: LEFT BREAST LUMPECTOMY WITH RADIOACTIVE SEED AND LEFT AXILLARY SENTINEL LYMPH NODE BIOPSY (Left Breast)     Patient location during evaluation: PACU Anesthesia Type: General Level of consciousness: awake and alert Pain management: pain level controlled Vital Signs Assessment: post-procedure vital signs reviewed and stable Respiratory status: spontaneous breathing, nonlabored ventilation and respiratory function stable Cardiovascular status: blood pressure returned to baseline and stable Postop Assessment: no apparent nausea or vomiting Anesthetic complications: no    Last Vitals:  Vitals:   01/20/19 0945 01/20/19 1025  BP: 122/86 126/84  Pulse: 81 85  Resp: 15 16  Temp:    SpO2: 100% 100%    Last Pain:  Vitals:   01/20/19 1025  TempSrc:   PainSc: 0-No pain                 Lynda Rainwater

## 2019-01-20 NOTE — Anesthesia Procedure Notes (Signed)
Procedure Name: LMA Insertion Date/Time: 01/20/2019 7:52 AM Performed by: Wanita Chamberlain, CRNA Pre-anesthesia Checklist: Patient identified, Emergency Drugs available, Suction available and Patient being monitored Patient Re-evaluated:Patient Re-evaluated prior to induction Oxygen Delivery Method: Circle system utilized Preoxygenation: Pre-oxygenation with 100% oxygen Induction Type: IV induction Ventilation: Mask ventilation without difficulty LMA: LMA inserted LMA Size: 4.0 Number of attempts: 1 Placement Confirmation: breath sounds checked- equal and bilateral,  CO2 detector and positive ETCO2 Tube secured with: Tape Dental Injury: Teeth and Oropharynx as per pre-operative assessment

## 2019-01-20 NOTE — Interval H&P Note (Signed)
History and Physical Interval Note:  01/20/2019 7:25 AM  Taylor Walker  has presented today for surgery, with the diagnosis of LEFT BREAST CANCER.  The various methods of treatment have been discussed with the patient and family.   Seed in place, in spite of mowing the lawn.  [name], here with her.  After consideration of risks, benefits and other options for treatment, the patient has consented to  Procedure(s): LEFT BREAST LUMPECTOMY WITH RADIOACTIVE SEED AND LEFT AXILLARY SENTINEL LYMPH NODE BIOPSY (Left) as a surgical intervention.  The patient's history has been reviewed, patient examined, no change in status, stable for surgery.  I have reviewed the patient's chart and labs.  Questions were answered to the patient's satisfaction.     Shann Medal

## 2019-01-21 ENCOUNTER — Encounter (HOSPITAL_BASED_OUTPATIENT_CLINIC_OR_DEPARTMENT_OTHER): Payer: Self-pay | Admitting: Surgery

## 2019-01-24 IMAGING — MG DIGITAL SCREENING BILATERAL MAMMOGRAM WITH CAD
4 series · 4 of 4 positions shown · non-contrast
Comparison: Previous exam(s).

CLINICAL DATA: Screening.

EXAM:
DIGITAL SCREENING BILATERAL MAMMOGRAM WITH CAD

[R CC]
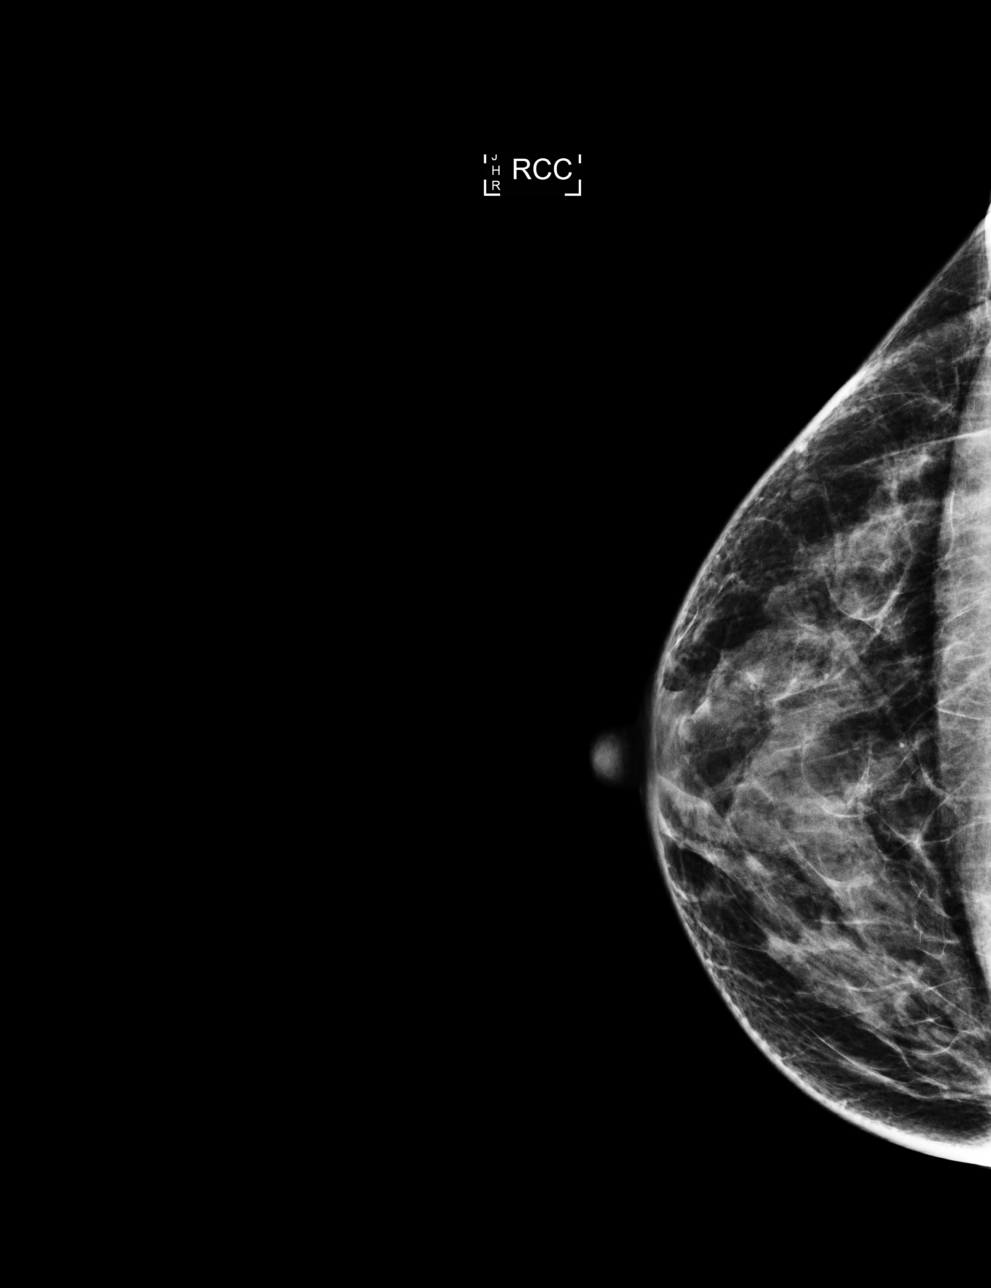

[L MLO]
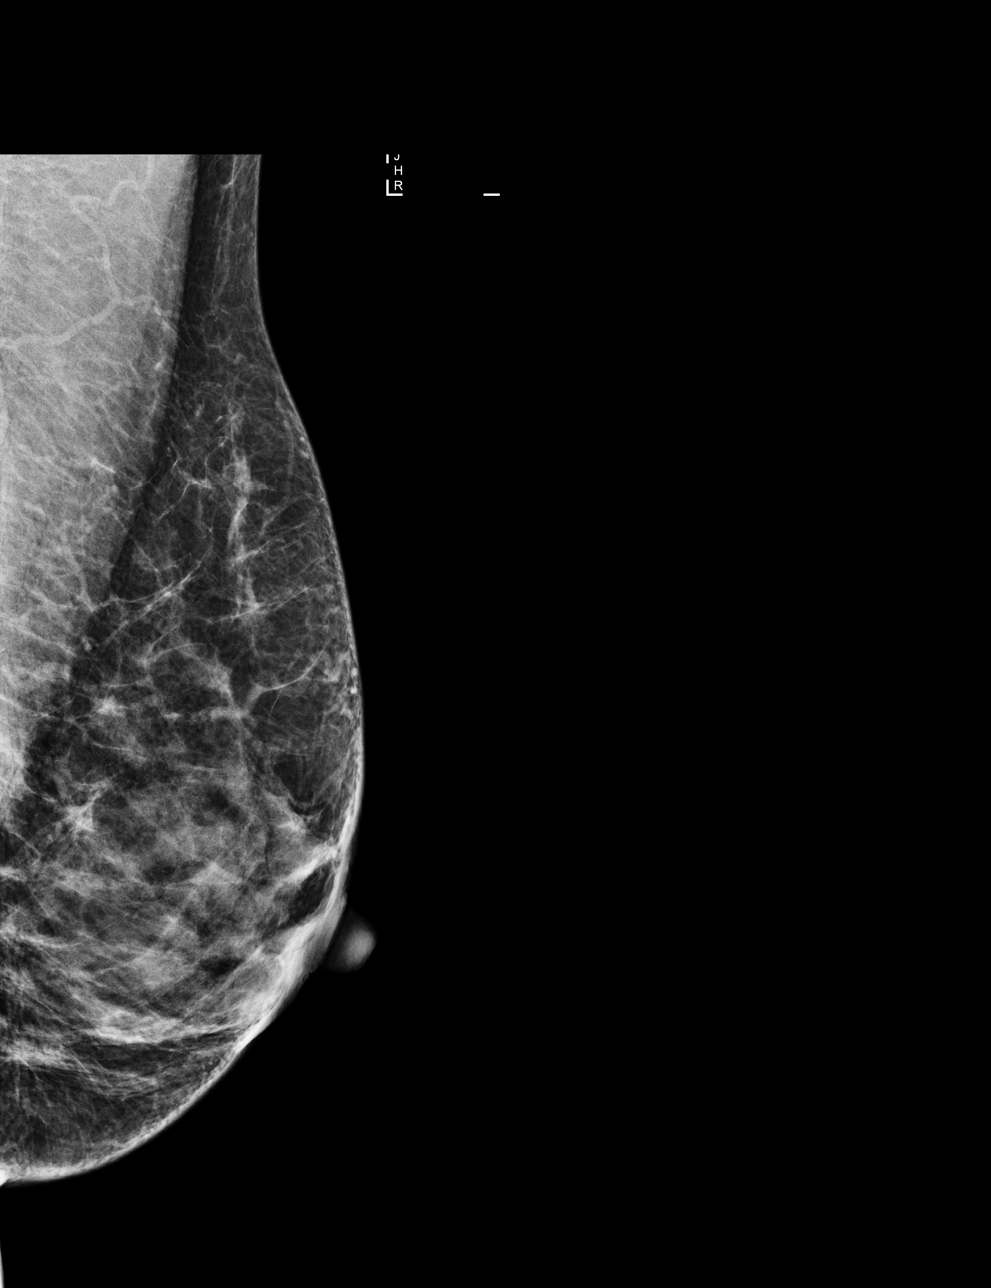

[L CC]
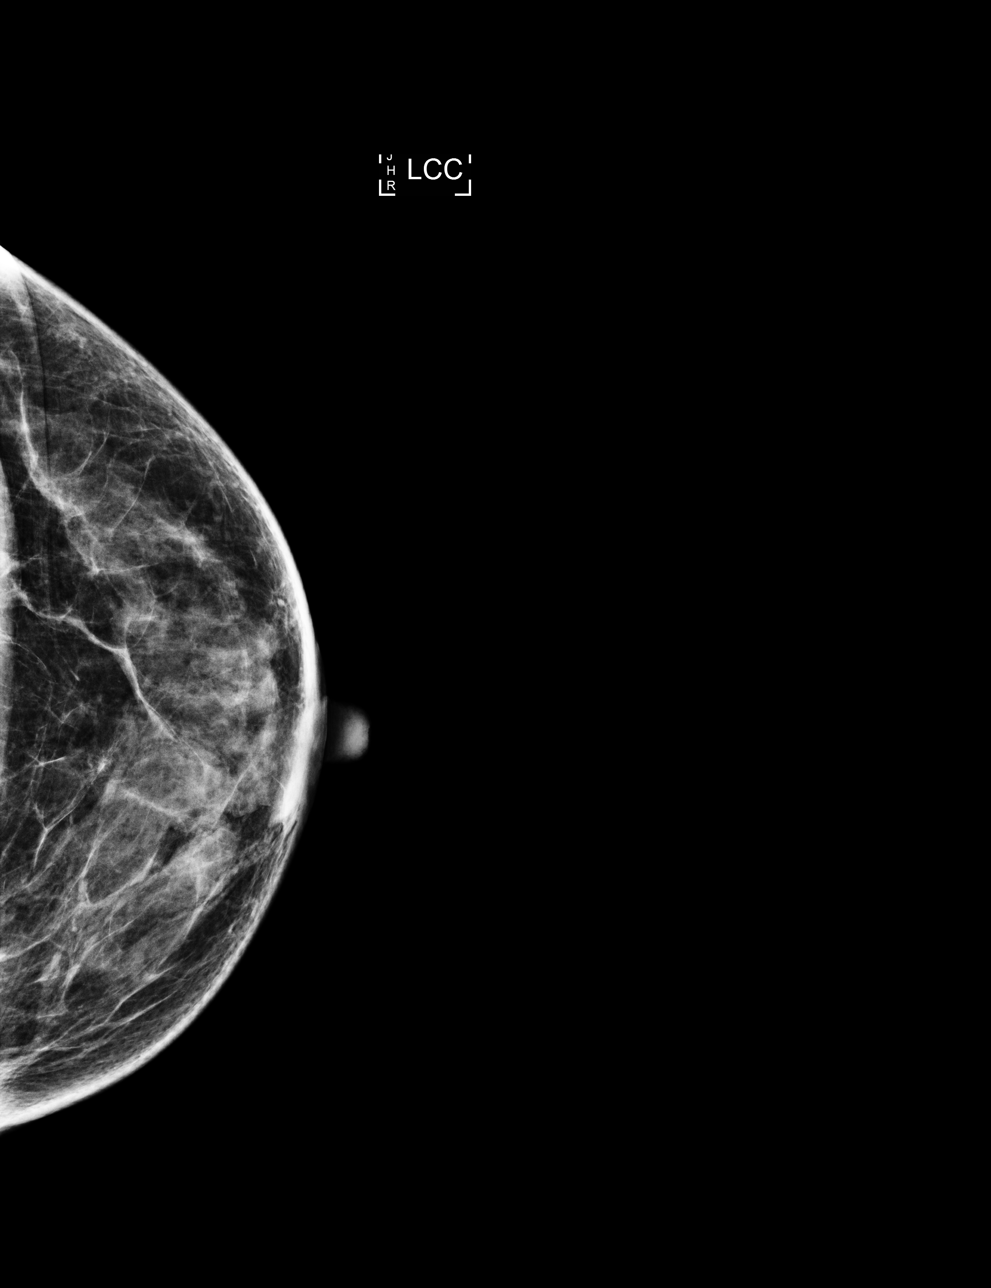

[R MLO]
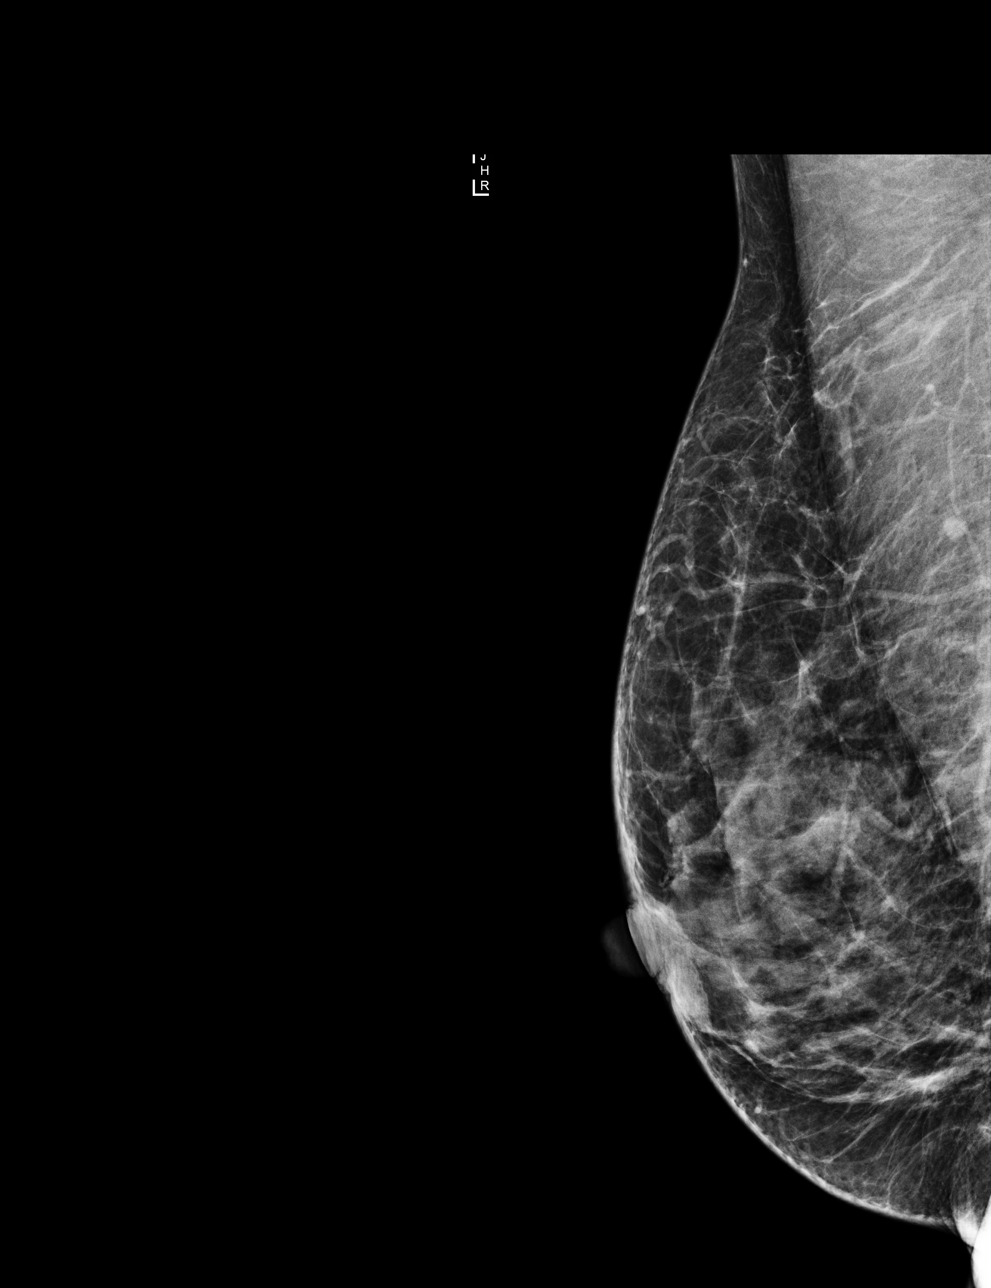

[4 of 4 positions shown; findings below may reference images not displayed]

ACR Breast Density Category c: The breast tissue is heterogeneously
dense, which may obscure small masses.
FINDINGS: There are no findings suspicious for malignancy. Images were
processed with CAD.
IMPRESSION: No mammographic evidence of malignancy. A result letter of this
screening mammogram will be mailed directly to the patient.

RECOMMENDATION:
Screening mammogram in one year. (Code:[id number]-[id number])

BI-RADS CATEGORY  1: Negative.

## 2019-02-14 ENCOUNTER — Ambulatory Visit: Payer: No Typology Code available for payment source | Attending: Surgery | Admitting: Physical Therapy

## 2019-02-14 ENCOUNTER — Other Ambulatory Visit: Payer: Self-pay

## 2019-02-14 DIAGNOSIS — Z483 Aftercare following surgery for neoplasm: Secondary | ICD-10-CM | POA: Diagnosis not present

## 2019-02-14 DIAGNOSIS — H903 Sensorineural hearing loss, bilateral: Secondary | ICD-10-CM | POA: Insufficient documentation

## 2019-02-14 DIAGNOSIS — M25612 Stiffness of left shoulder, not elsewhere classified: Secondary | ICD-10-CM

## 2019-02-14 DIAGNOSIS — M25512 Pain in left shoulder: Secondary | ICD-10-CM | POA: Insufficient documentation

## 2019-02-14 NOTE — Patient Instructions (Signed)

## 2019-02-14 NOTE — Therapy (Signed)
[place], [place], [address] Phone: [phone]   Fax:  [phone]  Physical Therapy Evaluation  Patient Details  Name: Taylor Walker MRN: [id number] Date of Birth: November 03, 1955 Referring Provider (PT): Dr. Lucia Gaskins   Encounter Date: 02/14/2019  PT End of Session - 02/14/19 1652    Visit Number  1    Number of Visits  3    Date for PT Re-Evaluation  03/17/19    PT Start Time  1600    PT Stop Time  1640    PT Time Calculation (min)  40 min    Activity Tolerance  Patient tolerated treatment well    Behavior During Therapy  [facility] for tasks assessed/performed       Past Medical History:  Diagnosis Date  . Hepatitis C    Grade 1-s/p ribaviron and interferon  . Myocardial infarction [facility])     Past Surgical History:  Procedure Laterality Date  . ABDOMINAL HYSTERECTOMY     benign-no h/o abnl pap  . BILATERAL OOPHORECTOMY    . BREAST BIOPSY    . BREAST EXCISIONAL BIOPSY    . BREAST LUMPECTOMY WITH RADIOACTIVE SEED AND SENTINEL LYMPH NODE BIOPSY Left 01/20/2019   Procedure: LEFT BREAST LUMPECTOMY WITH RADIOACTIVE SEED AND LEFT AXILLARY SENTINEL LYMPH NODE BIOPSY;  Surgeon: Alphonsa Overall, MD;  Location: [place];  Service: General;  Laterality: Left;  . JOINT REPLACEMENT     right knee  . TONSILLECTOMY      There were no vitals filed for this visit.   Subjective Assessment - 02/14/19 1608    Subjective  Pt states she will NOT have to have chemotherapy and that her shoulder motion is getting better  she will have radiation but it has not been scheduled yet     Pertinent History  pt had left breast cancer diagnosed 11/12/2018, with left lumpectomy with 01/20/2019 with one lymph node removed     Currently in Pain?  No/denies         [facility] PT Assessment - 02/14/19 0001      Assessment   Medical Diagnosis  left breast cancer     Referring Provider (PT)  Dr. Lucia Gaskins    Onset  Date/Surgical Date  11/12/18    Hand Dominance  Left      Precautions   Precautions  None      Restrictions   Weight Bearing Restrictions  No      Balance Screen   Has the patient fallen in the past 6 months  No    Has the patient had a decrease in activity level because of a fear of falling?   No    Is the patient reluctant to leave their home because of a fear of falling?   No      Home Film/video editor residence    Living Arrangements  Alone      Prior Function   Level of Independence  Independent    Vocation  Other (comment)   goes back to work [employer]  works as Land at [employer]  had been avid exerciseser even doing 120 push ups per day , did weights at home and band       Cognition   Overall Cognitive Status  Within Functional Limits for tasks assessed      Observation/Other Assessments   Observations  Pt with heading incision  in left upper chest     Skin Integrity  no open areas     Other Surveys   Quick Dash    Quick DASH   13.64      Sensation   Light Touch  Not tested      Coordination   Gross Motor Movements are Fluid and Coordinated  No   limited by pain in left upper quadrant      Posture/Postural Control   Posture/Postural Control  No significant limitations      ROM / Strength   AROM / PROM / Strength  AROM      AROM   Overall AROM Comments  pt grimaces in pain with elevation of left shoulder     Right/Left Shoulder  Right;Left    Right Shoulder Flexion  180 Degrees    Right Shoulder ABduction  180 Degrees    Left Shoulder Flexion  140 Degrees    Left Shoulder ABduction  135 Degrees      Strength   Overall Strength  Deficits    Overall Strength Comments  about 3-/5 in left shoulder due to pain       Palpation   Palpation comment  firm tender area a left chest medial to incision         LYMPHEDEMA/ONCOLOGY QUESTIONNAIRE - 02/14/19 1620      Right Upper Extremity Lymphedema   10  cm Proximal to Olecranon Process  29 cm    Olecranon Process  23 cm    15 cm Proximal to Ulnar Styloid Process  23.5 cm    Just Proximal to Ulnar Styloid Process  15 cm    Across Hand at [employer]  19.5 cm    At [place] of 2nd Digit  5.8 cm      Left Upper Extremity Lymphedema   15 cm Proximal to Olecranon Process  28 cm    Olecranon Process  23 cm    15 cm Proximal to Ulnar Styloid Process  23.5 cm    Just Proximal to Ulnar Styloid Process  15 cm    Across Hand at [employer]  20 cm    At [place] of 2nd Digit  5.8 cm          Quick Dash - 02/14/19 0001    Open a tight or new jar  No difficulty    Do heavy household chores (wash walls, wash floors)  No difficulty    Carry a shopping bag or briefcase  No difficulty    Wash your back  No difficulty    Use a knife to cut food  No difficulty    Recreational activities in which you take some force or impact through your arm, shoulder, or hand (golf, hammering, tennis)  No difficulty    During the past week, to what extent has your arm, shoulder or hand problem interfered with your normal social activities with family, friends, neighbors, or groups?  Modererately    During the past week, to what extent has your arm, shoulder or hand problem limited your work or other regular daily activities  Modererately    Arm, shoulder, or hand pain.  Mild    Tingling (pins and needles) in your arm, shoulder, or hand  Mild    Difficulty Sleeping  No difficulty    DASH Score  13.64 %        Objective measurements completed on examination: See above findings.      [place]  Adult PT Treatment/Exercise - 02/14/19 0001      Self-Care   Self-Care  Other Self-Care Comments    Other Self-Care Comments   small dotted foam on white foam patch for pt to wear at full area of left chest under sports bra       Exercises   Exercises  Shoulder      Shoulder Exercises: Supine   Protraction  AROM;Left;10 reps    Flexion  AAROM;Both;5 reps    Flexion  Limitations  with dowel, limited by pain     ABduction  AAROM;Left;5 reps    ABduction Limitations  limted by pain     Other Supine Exercises  radiation position stretch with elbow supported by pillow to decrease pain              PT Education - 02/14/19 1643    Education Details  supine dowel exercises     Person(s) Educated  Patient    Methods  Handout    Comprehension  Verbalized understanding;Returned demonstration          PT Long Term Goals - 02/14/19 1700      PT LONG TERM GOAL #1   Title  Pt will have 160 degrees of left shoulder flexion and abduction     Time  4    Period  Weeks    Status  New      PT LONG TERM GOAL #2   Title  Pt will be independent in advance HEP for ROM and strength    Time  4    Period  Weeks    Status  New      PT LONG TERM GOAL #3   Title  Pt will verbalize understanding of lymphedema risk reduction     Baseline  4    Period  Weeks    Status  New      PT LONG TERM GOAL #4   Title  Pt will decreased Quick DASH to < 10 indicating an improvment in functional strength     Time  4    Period  Weeks    Status  New             Plan - 02/14/19 1652    Clinical Impression Statement  Pt presents to PT about 3.5 weeks after surgery for lympectomy with 1 sentinel node removed.  She has pain and stiffness in left shoulder.  She will have radiation but no chemo. She is an avid exerciser and will benefit from at least one more session of PT in a few weeks after more healing to learn the strength ABC program.     Personal Factors and Comorbidities  Other;Profession   lives far away from clinic, lives alone, will need upper body strength in job    Examination-Activity Limitations  Reach Overhead    Stability/Clinical Decision Making  Evolving/Moderate complexity   pt to have radiation therapy    Clinical Decision Making  Moderate    Rehab Potential  Good    PT Frequency  Other (comment)   1-2 visits in a few weeks after more healing    PT Duration  4 weeks    PT Treatment/Interventions  ADLs/Self Care Home Management;Therapeutic activities;Therapeutic exercise;Orthotic Fit/Training;Patient/family education;Manual techniques;Manual lymph drainage;Passive range of motion;Scar mobilization    PT Next Visit Plan  reassess ROM, teach Strength ABC and encourage Cancer Fit., teach lymphedema risk reduction and use of a prophylactic sleeve     Consulted and Agree  with Plan of Care  Patient       Patient will benefit from skilled therapeutic intervention in order to improve the following deficits and impairments:  Decreased skin integrity, Increased edema, Decreased scar mobility, Decreased range of motion, Pain, Impaired UE functional use, Increased fascial restricitons, Decreased knowledge of use of DME, Decreased knowledge of precautions  Visit Diagnosis: Aftercare following surgery for neoplasm - Plan: PT plan of care cert/re-cert  Stiffness of left shoulder, not elsewhere classified - Plan: PT plan of care cert/re-cert  Acute pain of left shoulder - Plan: PT plan of care cert/re-cert     Problem List Patient Active Problem List   Diagnosis Date Noted  . Breast mass 10/27/2018  . Muscle strain 02/24/2018  . Headache(784.0) 04/11/2013  . Arthritis 04/16/2011  . Insomnia 04/16/2011  . Chronic active hepatitis C-genotype 1a 05/15/2010  . TOBACCO USE DISORDER/SMOKER-SMOKING CESSATION DISCUSSED 05/15/2010  . Coronary atherosclerosis 05/15/2010  . Sebaceous cyst 05/15/2010   Donato Heinz. Owens Shark PT  Norwood Levo 02/14/2019, 5:03 PM  [place], [place], [address] Phone: [phone]   Fax:  [phone]  Name: Kerstyn Coryell MRN: [id number] Date of Birth: 04/26/1956

## 2019-02-28 ENCOUNTER — Ambulatory Visit: Payer: No Typology Code available for payment source | Attending: Surgery | Admitting: Physical Therapy

## 2019-02-28 ENCOUNTER — Other Ambulatory Visit: Payer: Self-pay

## 2019-02-28 DIAGNOSIS — Z483 Aftercare following surgery for neoplasm: Secondary | ICD-10-CM | POA: Diagnosis present

## 2019-02-28 DIAGNOSIS — M25512 Pain in left shoulder: Secondary | ICD-10-CM

## 2019-02-28 DIAGNOSIS — M25612 Stiffness of left shoulder, not elsewhere classified: Secondary | ICD-10-CM | POA: Insufficient documentation

## 2019-02-28 NOTE — Therapy (Signed)
[place], [place], [address] Phone: [phone]   Fax:  [phone]  Physical Therapy Treatment  Patient Details  Name: Taylor Walker MRN: [id number] Date of Birth: 16-Aug-1956 Referring Provider (PT): Dr. Lucia Gaskins   Encounter Date: 02/28/2019  PT End of Session - 02/28/19 1324    Visit Number  2    Number of Visits  3    Date for PT Re-Evaluation  03/17/19    PT Start Time  1232    PT Stop Time  1315    PT Time Calculation (min)  43 min    Activity Tolerance  Patient tolerated treatment well    Behavior During Therapy  [facility] for tasks assessed/performed       Past Medical History:  Diagnosis Date  . Hepatitis C    Grade 1-s/p ribaviron and interferon  . Myocardial infarction [facility])     Past Surgical History:  Procedure Laterality Date  . ABDOMINAL HYSTERECTOMY     benign-no h/o abnl pap  . BILATERAL OOPHORECTOMY    . BREAST BIOPSY    . BREAST EXCISIONAL BIOPSY    . BREAST LUMPECTOMY WITH RADIOACTIVE SEED AND SENTINEL LYMPH NODE BIOPSY Left 01/20/2019   Procedure: LEFT BREAST LUMPECTOMY WITH RADIOACTIVE SEED AND LEFT AXILLARY SENTINEL LYMPH NODE BIOPSY;  Surgeon: Alphonsa Overall, MD;  Location: [place];  Service: General;  Laterality: Left;  . JOINT REPLACEMENT     right knee  . TONSILLECTOMY      There were no vitals filed for this visit.  Subjective Assessment - 02/28/19 1312    Subjective  "I think I'm doing excellent "  U feel like I'm back to my old self.  Pt states she has been doing shoulder exercises andhas been walking at least 2 miles every other day .  She plans to go back to work tomorrow. She will ask Dr. Pablo Ledger if she is at higher risk for Coronavirus while she is doing her radiation and if she should not be going back to work while she is undergoing radiation     Pertinent History  pt had left breast cancer diagnosed 11/12/2018, with left lumpectomy with  01/20/2019 with one lymph node removed     Currently in Pain?  No/denies         [facility] PT Assessment - 02/28/19 0001      Observation/Other Assessments   Quick DASH   11.36      AROM   Left Shoulder Flexion  165 Degrees    Left Shoulder ABduction  170 Degrees           Quick Dash - 02/28/19 0001    Open a tight or new jar  No difficulty    Do heavy household chores (wash walls, wash floors)  Moderate difficulty    Carry a shopping bag or briefcase  No difficulty    Wash your back  No difficulty    Use a knife to cut food  No difficulty    Recreational activities in which you take some force or impact through your arm, shoulder, or hand (golf, hammering, tennis)  Mild difficulty    During the past week, to what extent has your arm, shoulder or hand problem interfered with your normal social activities with family, friends, neighbors, or groups?  Not at all    During the past week, to what extent has your arm, shoulder or hand problem limited your work or other regular  daily activities  Not at all    Arm, shoulder, or hand pain.  Moderate    Tingling (pins and needles) in your arm, shoulder, or hand  None    Difficulty Sleeping  No difficulty    DASH Score  11.36 %             [facility] Adult PT Treatment/Exercise - 02/28/19 0001      Self-Care   Self-Care  Other Self-Care Comments    Other Self-Care Comments   reviewed lymphedema risk precautions and  gave pt information about compresson sleeve ( where to get one ) and a rescription to take to Dr. Pablo Ledger or Dr. Chancy Milroy to sign for insurance coverage       Exercises   Exercises  Other Exercises    Other Exercises   reviewed Strength ABC program and ways to progress starting very low and progressing slowly.. Pt feels confident in doing exercises as she has been a lifelong exerciser .She acknowldeged that she needs to start low              PT Education - 02/28/19 1323    Education Details  lymphedema risk  reduction, where to get a compression sleeve and gauntlet, Strength ABC program     Person(s) Educated  Patient    Methods  Explanation;Handout    Comprehension  Verbalized understanding;Returned demonstration          PT Long Term Goals - 02/28/19 1312      PT LONG TERM GOAL #1   Title  Pt will have 160 degrees of left shoulder flexion and abduction     Status  Achieved      PT LONG TERM GOAL #2   Title  Pt will be independent in advance HEP for ROM and strength    Status  Achieved      PT LONG TERM GOAL #3   Title  Pt will verbalize understanding of lymphedema risk reduction     Status  Achieved      PT LONG TERM GOAL #4   Title  Pt will decreased Quick DASH to < 10 indicating an improvment in functional strength     Status  Partially Met            Plan - 02/28/19 1324    Clinical Impression Statement  Pt is doing very well.  She has been wearing foam patch and has reduction in fullness in left chest, but still has some firmness around incision.  Her sports bras do not offer much compression, but she says she has some at home that do.  She no longer needs to wear the foam patch.  She was instructed in lymphedema risk reducition and where to get a cmpression sleeve and gauntlet with handouts given.  She was instructed in how to progress exercise.  She has met nearly all goals, except for the goal for Quick DASH which is less, but not < 10. She will be starting radiation and no longer needs skilled PT at this time.  Episode discharged.     Rehab Potential  Good    PT Frequency  Other (comment)    PT Duration  4 weeks    PT Next Visit Plan  discharge        Patient will benefit from skilled therapeutic intervention in order to improve the following deficits and impairments:  Decreased skin integrity, Increased edema, Decreased scar mobility, Decreased range of motion, Pain, Impaired UE functional use,  Increased fascial restricitons, Decreased knowledge of use of DME,  Decreased knowledge of precautions  Visit Diagnosis: Aftercare following surgery for neoplasm  Stiffness of left shoulder, not elsewhere classified  Acute pain of left shoulder     Problem List Patient Active Problem List   Diagnosis Date Noted  . Breast mass 10/27/2018  . Muscle strain 02/24/2018  . Headache(784.0) 04/11/2013  . Arthritis 04/16/2011  . Insomnia 04/16/2011  . Chronic active hepatitis C-genotype 1a 05/15/2010  . TOBACCO USE DISORDER/SMOKER-SMOKING CESSATION DISCUSSED 05/15/2010  . Coronary atherosclerosis 05/15/2010  . Sebaceous cyst 05/15/2010   PHYSICAL THERAPY DISCHARGE SUMMARY  Visits from Start of Care: 2  Current functional level related to goals / functional outcomes: independent   Remaining deficits: As above   Education / Equipment: As above  Plan: Patient agrees to discharge.  Patient goals were partially met. Patient is being discharged due to being pleased with the current functional level.  ?????    Donato Heinz. Owens Shark PT  Norwood Levo 02/28/2019, 1:29 PM  [place], [place], [address] Phone: [phone]   Fax:  [phone]  Name: Taylor Walker MRN: [id number] Date of Birth: 02/14/1956

## 2019-02-28 NOTE — Patient Instructions (Signed)
First of all, check with your insurance company to see if provider is in network    [facility] (for wigs and compression sleeves / gloves/gauntlets )  [address] [place], [place] [address] [phone]  Will file some insurances --- call for appointment   [facility] (for mastectomy prosthetics and garments) [address] [place], [place] [address] [phone] Will file some insurances --- call for appointment  [facility]  [address]  [place], [place] [address] [phone] Lower extremity garments  [facility] [address] [place], [place]  [address] [phone]  Cathy Rubel ( Medicaid certified lymphedema fitter) [phone] [email or web address]  Melissa Meares  [facility]  [phone]  [facility] [address] [place], [place] [address] [phone]  Other Resources: National Lymphedema Network:  www.lymphnet.org www.Klosetraining.com for patient articles and self manual lymph drainage information www.lymphedemablog.com has informative articles.  www.compressionguru.com www.lymphedemaproducts.com www.brightlifedirect.com www.compressionguru.com 

## 2019-03-07 ENCOUNTER — Encounter (HOSPITAL_COMMUNITY): Payer: Self-pay | Admitting: Oncology

## 2019-05-13 ENCOUNTER — Other Ambulatory Visit: Payer: Self-pay

## 2019-05-13 DIAGNOSIS — M1990 Unspecified osteoarthritis, unspecified site: Secondary | ICD-10-CM

## 2019-05-16 MED ORDER — TRAMADOL HCL 50 MG PO TABS: 50.0000 mg | ORAL_TABLET | Freq: Four times a day (QID) | ORAL | 1 refills | Status: DC | PRN

## 2019-05-17 ENCOUNTER — Other Ambulatory Visit: Payer: Self-pay

## 2019-05-17 ENCOUNTER — Encounter: Payer: Self-pay | Admitting: Family Medicine

## 2019-05-17 ENCOUNTER — Ambulatory Visit (INDEPENDENT_AMBULATORY_CARE_PROVIDER_SITE_OTHER): Payer: PRIVATE HEALTH INSURANCE | Admitting: Family Medicine

## 2019-05-17 VITALS — BP 114/70 | HR 82

## 2019-05-17 DIAGNOSIS — Z170 Estrogen receptor positive status [ER+]: Secondary | ICD-10-CM | POA: Diagnosis not present

## 2019-05-17 DIAGNOSIS — Z0000 Encounter for general adult medical examination without abnormal findings: Secondary | ICD-10-CM | POA: Diagnosis not present

## 2019-05-17 DIAGNOSIS — C50412 Malignant neoplasm of upper-outer quadrant of left female breast: Secondary | ICD-10-CM

## 2019-05-17 DIAGNOSIS — I2510 Atherosclerotic heart disease of native coronary artery without angina pectoris: Secondary | ICD-10-CM | POA: Diagnosis not present

## 2019-05-17 DIAGNOSIS — Z00129 Encounter for routine child health examination without abnormal findings: Secondary | ICD-10-CM

## 2019-05-17 NOTE — Assessment & Plan Note (Signed)
F/u with cardiology Continue ASA, lipitor

## 2019-05-17 NOTE — Progress Notes (Signed)
   Subjective:    Patient ID: Taylor Walker is a 63 y.o. female presenting with Annual Exam  on 05/17/2019  HPI: Here today for annual check up. She is now s/p XRT and lumpectomy for Stage 1 Breast Ca. Now on preventive. Getting stamina back. No other concerns today.  Review of Systems  Constitutional: Negative for chills and fever.  HENT: Negative for congestion, nosebleeds and rhinorrhea.   Eyes: Negative for visual disturbance.  Respiratory: Negative for chest tightness and shortness of breath.   Cardiovascular: Negative for chest pain.  Gastrointestinal: Negative for abdominal distention, abdominal pain, blood in stool, constipation, diarrhea, nausea and vomiting.  Genitourinary: Negative for dysuria, frequency, menstrual problem and vaginal bleeding.  Musculoskeletal: Negative for arthralgias.  Skin: Negative for rash.  Neurological: Negative for dizziness and headaches.  Psychiatric/Behavioral: Negative for behavioral problems, dysphoric mood and sleep disturbance.  All other systems reviewed and are negative.     Objective:    BP 114/70   Pulse 82   SpO2 99%  Physical Exam Constitutional:      Appearance: She is well-developed.  HENT:     Head: Normocephalic and atraumatic.     Right Ear: Tympanic membrane normal.     Left Ear: Tympanic membrane normal.  Eyes:     General: No scleral icterus.    Pupils: Pupils are equal, round, and reactive to light.  Neck:     Musculoskeletal: Normal range of motion.     Thyroid: No thyromegaly.  Cardiovascular:     Rate and Rhythm: Normal rate and regular rhythm.  Pulmonary:     Effort: Pulmonary effort is normal.     Breath sounds: Normal breath sounds.  Abdominal:     General: There is no distension.     Palpations: Abdomen is soft. There is no mass.     Tenderness: There is no abdominal tenderness.  Skin:    General: Skin is warm and dry.  Neurological:     Mental Status: She is alert and oriented to person, place,  and time.         Assessment & Plan:   Problem List Items Addressed This Visit      Unprioritized   Coronary atherosclerosis    F/u with cardiology Continue ASA, lipitor      Relevant Medications   nitroGLYCERIN (NITROSTAT) 0.4 MG SL tablet   Malignant neoplasm of upper-outer quadrant of left breast in female, estrogen receptor positive (Taylor Walker)    On Arimidex x 5 years, followed at [facility]      Relevant Medications   anastrozole (ARIMIDEX) 1 MG tablet    Other Visit Diagnoses    Encounter for routine child health examination without abnormal findings    -  Primary   Routine medical exam       Relevant Orders   TSH   CBC   Comprehensive metabolic panel   Hemoglobin A1c   VITAMIN D 25 Hydroxy (Vit-D Deficiency, Fractures)   Lipid panel   Routine general medical examination at a health care facility         Return in 1 year (on 05/16/2020).  Taylor Walker 05/17/2019 5:28 PM

## 2019-05-17 NOTE — Patient Instructions (Signed)

## 2019-05-17 NOTE — Assessment & Plan Note (Signed)
On Arimidex x 5 years, followed at [facility]

## 2019-05-18 ENCOUNTER — Encounter: Payer: Self-pay | Admitting: Family Medicine

## 2019-05-18 LAB — COMPREHENSIVE METABOLIC PANEL
ALT: 19 IU/L (ref 0–32)
AST: 22 IU/L (ref 0–40)
Albumin/Globulin Ratio: 1.8 (ref 1.2–2.2)
Albumin: 4.6 g/dL (ref 3.8–4.8)
Alkaline Phosphatase: 37 IU/L — ABNORMAL LOW (ref 39–117)
BUN/Creatinine Ratio: 19 (ref 12–28)
BUN: 18 mg/dL (ref 8–27)
Bilirubin Total: 0.2 mg/dL (ref 0.0–1.2)
CO2: 21 mmol/L (ref 20–29)
Calcium: 9.4 mg/dL (ref 8.7–10.3)
Chloride: 100 mmol/L (ref 96–106)
Creatinine, Ser: 0.96 mg/dL (ref 0.57–1.00)
GFR calc Af Amer: 74 mL/min/{1.73_m2} (ref >59)
GFR calc non Af Amer: 64 mL/min/{1.73_m2} (ref >59)
Globulin, Total: 2.5 g/dL (ref 1.5–4.5)
Glucose: 89 mg/dL (ref 65–99)
Potassium: 4.4 mmol/L (ref 3.5–5.2)
Sodium: 139 mmol/L (ref 134–144)
Total Protein: 7.1 g/dL (ref 6.0–8.5)

## 2019-05-18 LAB — CBC
Hematocrit: 38.8 % (ref 34.0–46.6)
Hemoglobin: 13.2 g/dL (ref 11.1–15.9)
MCH: 33 pg (ref 26.6–33.0)
MCHC: 34 g/dL (ref 31.5–35.7)
MCV: 97 fL (ref 79–97)
Platelets: 199 10*3/uL (ref 150–450)
RBC: 4 x10E6/uL (ref 3.77–5.28)
RDW: 12.1 % (ref 11.7–15.4)
WBC: 6 10*3/uL (ref 3.4–10.8)

## 2019-05-18 LAB — HEMOGLOBIN A1C
Est. average glucose Bld gHb Est-mCnc: 111 mg/dL
Hgb A1c MFr Bld: 5.5 % (ref 4.8–5.6)

## 2019-05-18 LAB — TSH: TSH: 0.866 u[IU]/mL (ref 0.450–4.500)

## 2019-05-18 LAB — VITAMIN D 25 HYDROXY (VIT D DEFICIENCY, FRACTURES): Vit D, 25-Hydroxy: 39.8 ng/mL (ref 30.0–100.0)

## 2019-05-18 LAB — LIPID PANEL
Chol/HDL Ratio: 2.7 ratio (ref 0.0–4.4)
Cholesterol, Total: 178 mg/dL (ref 100–199)
HDL: 66 mg/dL (ref >39)
LDL Calculated: 102 mg/dL — ABNORMAL HIGH (ref 0–99)
Triglycerides: 52 mg/dL (ref 0–149)
VLDL Cholesterol Cal: 10 mg/dL (ref 5–40)

## 2019-07-25 ENCOUNTER — Other Ambulatory Visit: Payer: Self-pay | Admitting: Surgery

## 2019-07-25 DIAGNOSIS — Z9889 Other specified postprocedural states: Secondary | ICD-10-CM

## 2019-08-03 ENCOUNTER — Ambulatory Visit
Admission: RE | Admit: 2019-08-03 | Discharge: 2019-08-03 | Disposition: A | Discharge status: Home / Self Care | Payer: PRIVATE HEALTH INSURANCE | Source: Ambulatory Visit | Attending: Surgery | Admitting: Surgery

## 2019-08-03 ENCOUNTER — Other Ambulatory Visit: Payer: Self-pay

## 2019-08-03 DIAGNOSIS — Z9889 Other specified postprocedural states: Secondary | ICD-10-CM

## 2019-08-29 ENCOUNTER — Other Ambulatory Visit: Payer: Self-pay | Admitting: *Deleted

## 2019-08-29 DIAGNOSIS — M1990 Unspecified osteoarthritis, unspecified site: Secondary | ICD-10-CM

## 2019-08-29 MED ORDER — TRAMADOL HCL 50 MG PO TABS: 50.0000 mg | ORAL_TABLET | Freq: Four times a day (QID) | ORAL | 1 refills | Status: DC | PRN

## 2019-11-25 ENCOUNTER — Other Ambulatory Visit: Payer: Self-pay | Admitting: Family Medicine

## 2019-11-25 DIAGNOSIS — M1990 Unspecified osteoarthritis, unspecified site: Secondary | ICD-10-CM

## 2020-04-23 IMAGING — MG DIGITAL DIAGNOSTIC BILAT W/ TOMO W/ CAD
9 series · 9 of 25 positions shown · non-contrast
Comparison: Previous exam(s).

CLINICAL DATA: Patient status post interval left breast lumpectomy.

EXAM:
DIGITAL DIAGNOSTIC BILATERAL MAMMOGRAM WITH CAD AND TOMO

[L MLO]
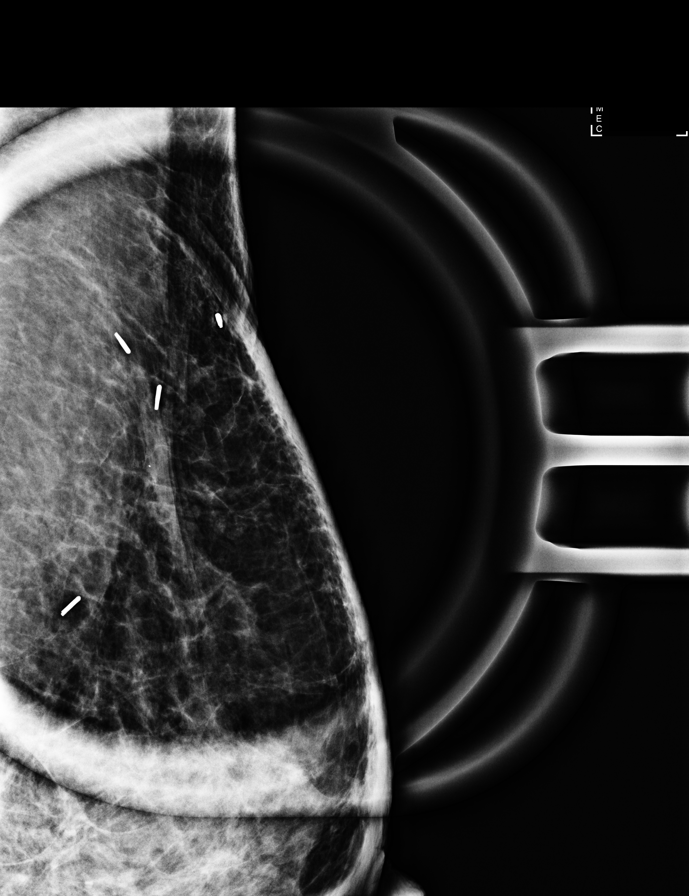

[R MLO synth-2D]
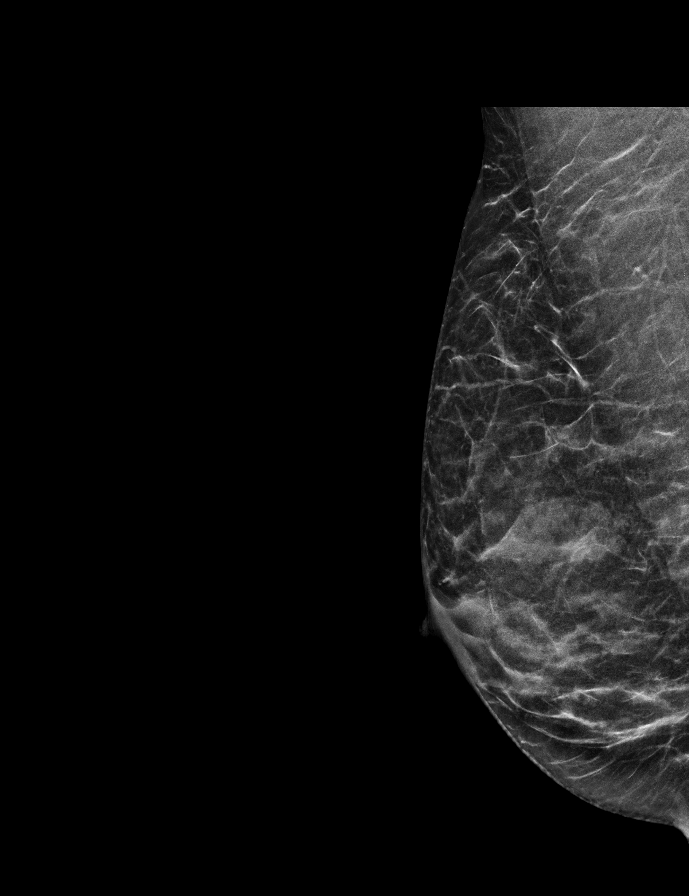

[L MLO synth-2D]
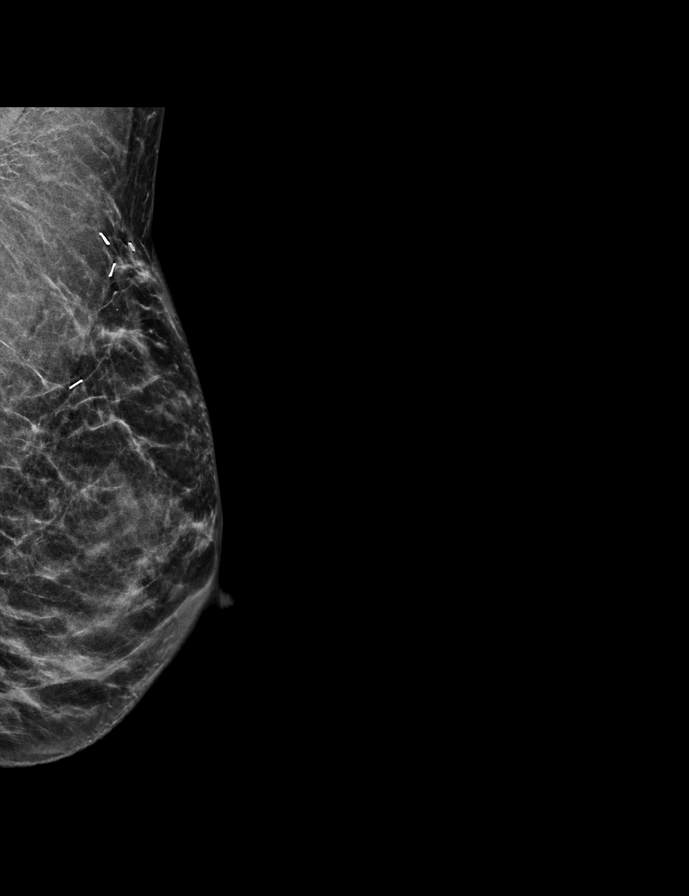

[L CC synth-2D]
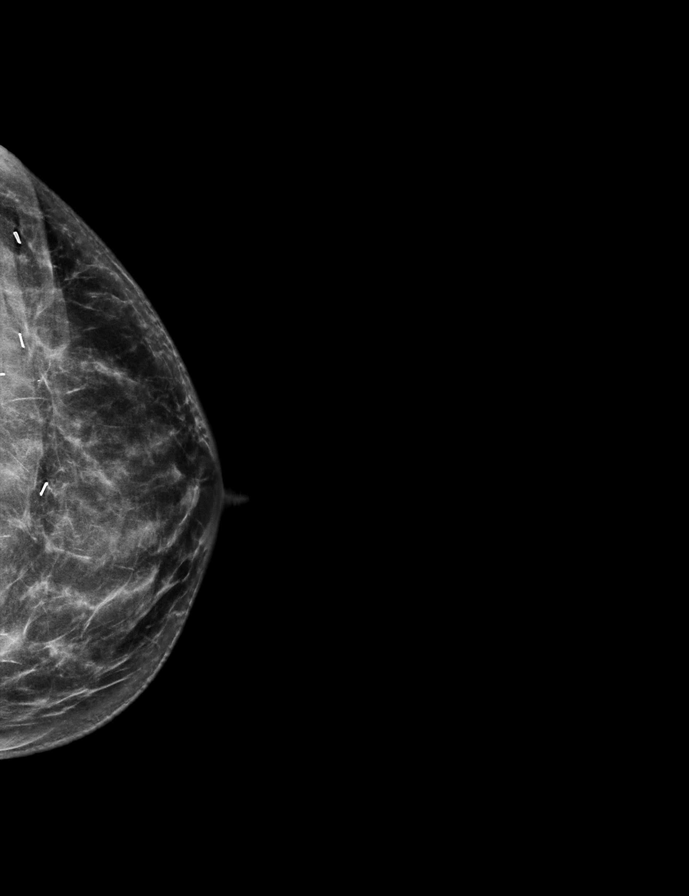

[R CC synth-2D]
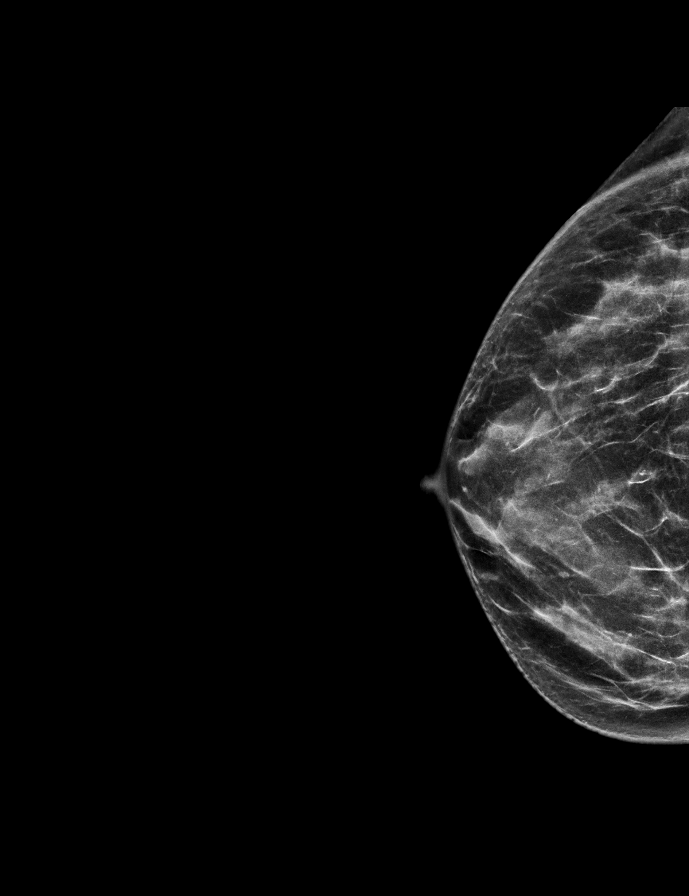

[L MLO tomo · tomo slice 26/51.0]
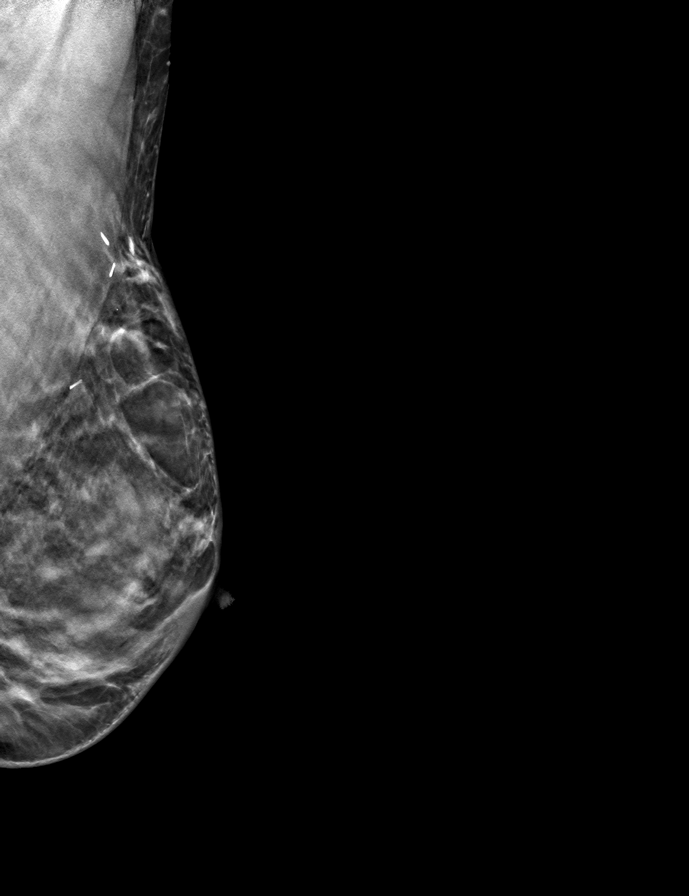

[L CC tomo · tomo slice 31/60.0]
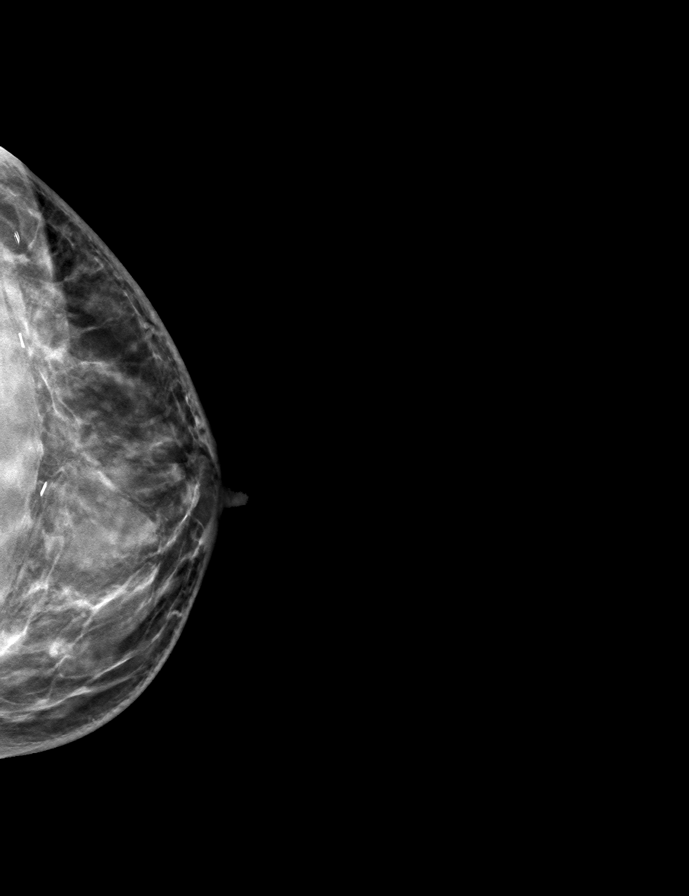

[R MLO tomo · tomo slice 23/44.0]
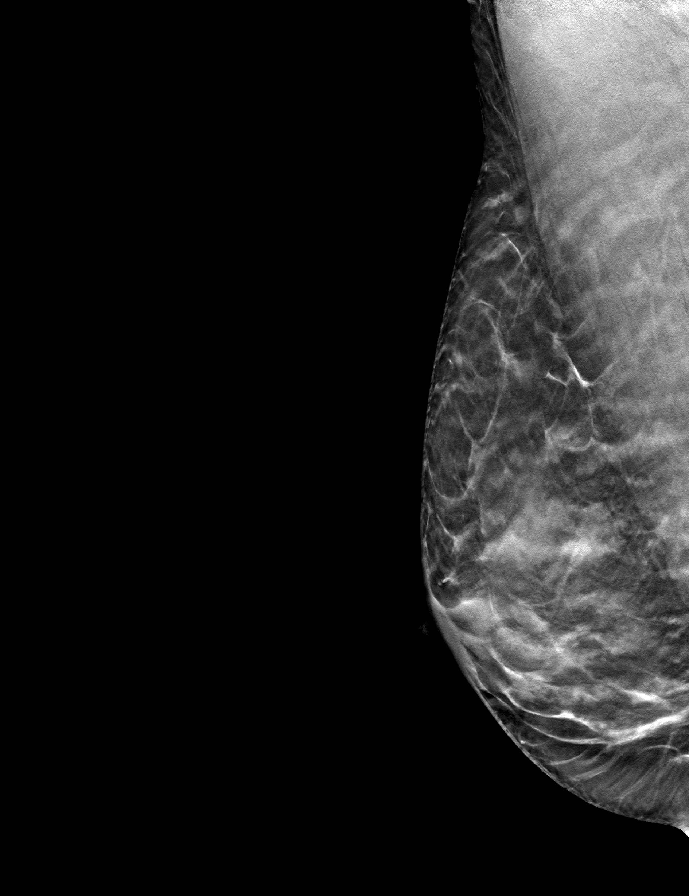

[R CC tomo · tomo slice 24/47.0]
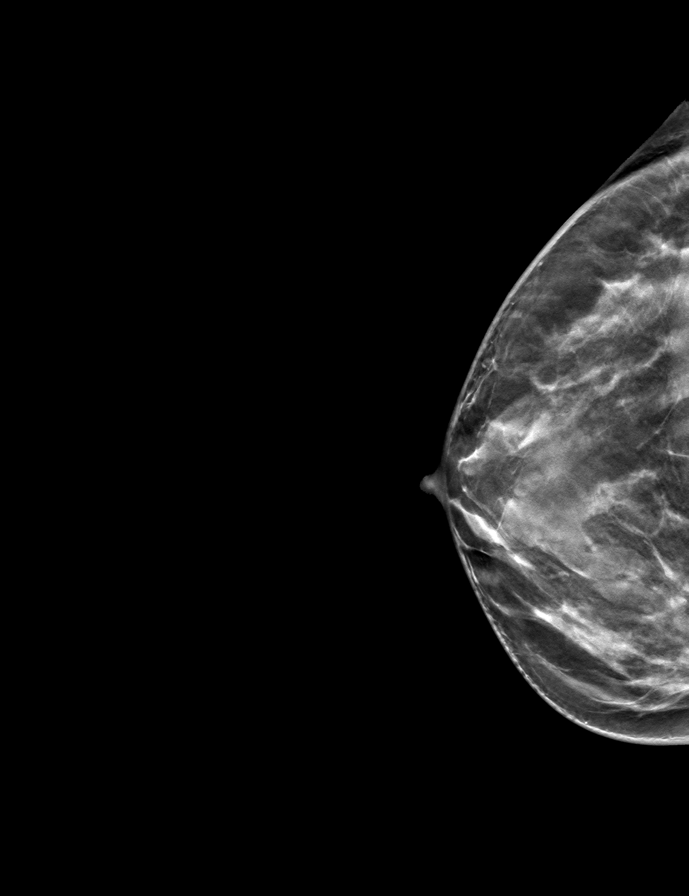

[9 of 25 positions shown; findings below may reference images not displayed]

ACR Breast Density Category c: The breast tissue is heterogeneously
dense, which may obscure small masses.
FINDINGS: Interval postsurgical changes left breast. No new masses,
calcifications or nonsurgical distortion identified within either
breast.

Mammographic images were processed with CAD.
IMPRESSION: No mammographic evidence for malignancy.

RECOMMENDATION:
Bilateral diagnostic mammography 1 year.

I have discussed the findings and recommendations with the patient.
If applicable, a reminder letter will be sent to the patient
regarding the next appointment.

BI-RADS CATEGORY  2: Benign.

## 2020-07-23 ENCOUNTER — Other Ambulatory Visit: Payer: Self-pay | Admitting: Surgery

## 2020-07-23 DIAGNOSIS — Z9889 Other specified postprocedural states: Secondary | ICD-10-CM

## 2020-09-03 ENCOUNTER — Ambulatory Visit
Admission: RE | Admit: 2020-09-03 | Discharge: 2020-09-03 | Disposition: A | Discharge status: Home / Self Care | Payer: PRIVATE HEALTH INSURANCE | Source: Ambulatory Visit | Attending: Surgery | Admitting: Surgery

## 2020-09-03 ENCOUNTER — Encounter: Payer: Self-pay | Admitting: Family Medicine

## 2020-09-03 ENCOUNTER — Other Ambulatory Visit: Payer: Self-pay

## 2020-09-03 DIAGNOSIS — Z9889 Other specified postprocedural states: Secondary | ICD-10-CM

## 2021-07-30 ENCOUNTER — Other Ambulatory Visit: Payer: Self-pay | Admitting: Surgery

## 2021-07-30 DIAGNOSIS — Z9889 Other specified postprocedural states: Secondary | ICD-10-CM

## 2021-09-10 ENCOUNTER — Ambulatory Visit
Admission: RE | Admit: 2021-09-10 | Discharge: 2021-09-10 | Disposition: A | Discharge status: Home / Self Care | Payer: No Typology Code available for payment source | Source: Ambulatory Visit | Attending: Surgery | Admitting: Surgery

## 2021-09-10 ENCOUNTER — Other Ambulatory Visit: Payer: Self-pay | Admitting: Surgery

## 2021-09-10 ENCOUNTER — Encounter: Payer: Self-pay | Admitting: Family Medicine

## 2021-09-10 DIAGNOSIS — Z9889 Other specified postprocedural states: Secondary | ICD-10-CM

## 2021-09-10 HISTORY — DX: Personal history of irradiation: Z92.3

## 2022-10-15 ENCOUNTER — Other Ambulatory Visit: Payer: Self-pay

## 2022-10-15 DIAGNOSIS — Z1231 Encounter for screening mammogram for malignant neoplasm of breast: Secondary | ICD-10-CM

## 2022-10-23 ENCOUNTER — Ambulatory Visit
Admission: RE | Admit: 2022-10-23 | Discharge: 2022-10-23 | Disposition: A | Discharge status: Home / Self Care | Payer: No Typology Code available for payment source | Source: Ambulatory Visit | Attending: Family Medicine | Admitting: Family Medicine

## 2022-10-23 DIAGNOSIS — Z1231 Encounter for screening mammogram for malignant neoplasm of breast: Secondary | ICD-10-CM

## 2022-10-28 ENCOUNTER — Other Ambulatory Visit: Payer: Self-pay | Admitting: Family Medicine

## 2022-10-28 DIAGNOSIS — R928 Other abnormal and inconclusive findings on diagnostic imaging of breast: Secondary | ICD-10-CM

## 2022-11-04 ENCOUNTER — Other Ambulatory Visit: Payer: No Typology Code available for payment source

## 2022-11-12 ENCOUNTER — Ambulatory Visit
Admission: RE | Admit: 2022-11-12 | Discharge: 2022-11-12 | Disposition: A | Discharge status: Home / Self Care | Payer: No Typology Code available for payment source | Source: Ambulatory Visit | Attending: Family Medicine | Admitting: Family Medicine

## 2022-11-12 ENCOUNTER — Other Ambulatory Visit: Payer: Self-pay | Admitting: Family Medicine

## 2022-11-12 DIAGNOSIS — R928 Other abnormal and inconclusive findings on diagnostic imaging of breast: Secondary | ICD-10-CM

## 2022-11-12 DIAGNOSIS — N6489 Other specified disorders of breast: Secondary | ICD-10-CM

## 2023-03-28 LAB — COLOGUARD: COLOGUARD: NEGATIVE

## 2023-05-04 ENCOUNTER — Other Ambulatory Visit: Payer: No Typology Code available for payment source
